# Patient Record
Sex: Male | Born: 1990 | Race: White | Hispanic: No | Marital: Single | Smoking: Former smoker
Health system: Southern US, Community
[De-identification: ages and names within clinical notes are randomized; demographics above are authoritative.]

## PROBLEM LIST (undated history)

## (undated) DIAGNOSIS — N200 Calculus of kidney: Secondary | ICD-10-CM

## (undated) DIAGNOSIS — IMO0002 Reserved for concepts with insufficient information to code with codable children: Secondary | ICD-10-CM

## (undated) HISTORY — PX: KIDNEY STONE SURGERY: SHX686

---

## 2010-10-12 ENCOUNTER — Emergency Department (HOSPITAL_COMMUNITY)
Admission: EM | Admit: 2010-10-12 | Discharge: 2010-10-13 | Disposition: A | Discharge status: Home / Self Care | Payer: BC Managed Care – PPO | Attending: Emergency Medicine | Admitting: Emergency Medicine

## 2010-10-12 ENCOUNTER — Emergency Department (HOSPITAL_COMMUNITY): Payer: BC Managed Care – PPO

## 2010-10-12 DIAGNOSIS — S40019A Contusion of unspecified shoulder, initial encounter: Secondary | ICD-10-CM | POA: Insufficient documentation

## 2010-10-12 DIAGNOSIS — W108XXA Fall (on) (from) other stairs and steps, initial encounter: Secondary | ICD-10-CM | POA: Insufficient documentation

## 2010-10-12 DIAGNOSIS — Y92009 Unspecified place in unspecified non-institutional (private) residence as the place of occurrence of the external cause: Secondary | ICD-10-CM | POA: Insufficient documentation

## 2011-06-23 ENCOUNTER — Emergency Department (HOSPITAL_COMMUNITY)
Admission: EM | Admit: 2011-06-23 | Discharge: 2011-06-23 | Disposition: A | Discharge status: Home / Self Care | Payer: BC Managed Care – PPO | Attending: Emergency Medicine | Admitting: Emergency Medicine

## 2011-06-23 ENCOUNTER — Emergency Department (HOSPITAL_COMMUNITY): Payer: BC Managed Care – PPO

## 2011-06-23 ENCOUNTER — Encounter: Payer: Self-pay | Admitting: Emergency Medicine

## 2011-06-23 DIAGNOSIS — S20229A Contusion of unspecified back wall of thorax, initial encounter: Secondary | ICD-10-CM | POA: Insufficient documentation

## 2011-06-23 DIAGNOSIS — S300XXA Contusion of lower back and pelvis, initial encounter: Secondary | ICD-10-CM

## 2011-06-23 DIAGNOSIS — W108XXA Fall (on) (from) other stairs and steps, initial encounter: Secondary | ICD-10-CM | POA: Insufficient documentation

## 2011-06-23 DIAGNOSIS — Z87442 Personal history of urinary calculi: Secondary | ICD-10-CM | POA: Insufficient documentation

## 2011-06-23 DIAGNOSIS — Z87891 Personal history of nicotine dependence: Secondary | ICD-10-CM | POA: Insufficient documentation

## 2011-06-23 HISTORY — DX: Calculus of kidney: N20.0

## 2011-06-23 MED ORDER — ONDANSETRON 8 MG PO TBDP
8.0000 mg | ORAL_TABLET | Freq: Once | ORAL | Status: AC
  Administered 2011-06-23: 8 mg via ORAL
  Filled 2011-06-23: qty 1

## 2011-06-23 MED ORDER — CYCLOBENZAPRINE HCL 5 MG PO TABS: 5.0000 mg | ORAL_TABLET | Freq: Three times a day (TID) | ORAL | Status: AC | PRN

## 2011-06-23 MED ORDER — OXYCODONE-ACETAMINOPHEN 5-325 MG PO TABS
2.0000 | ORAL_TABLET | Freq: Once | ORAL | Status: AC
  Administered 2011-06-23: 2 via ORAL
  Filled 2011-06-23: qty 2

## 2011-06-23 MED ORDER — CYCLOBENZAPRINE HCL 10 MG PO TABS: 10.0000 mg | ORAL_TABLET | Freq: Two times a day (BID) | ORAL | Status: AC | PRN

## 2011-06-23 MED ORDER — NAPROXEN 500 MG PO TABS: 500.0000 mg | ORAL_TABLET | Freq: Two times a day (BID) | ORAL | Status: DC

## 2011-06-23 MED ORDER — OXYCODONE-ACETAMINOPHEN 5-325 MG PO TABS: 1.0000 | ORAL_TABLET | ORAL | Status: AC | PRN

## 2011-06-23 NOTE — ED Notes (Signed)
Patient reports falling down 7-8 stairs this morning and hitting lower back. Patient reports pain radiating from lower back into legs bilaterally and up into shoulders and neck. Patient denies hitting neck or head. Denies any LOC, blurred vision, or dizziness.

## 2011-06-28 NOTE — ED Provider Notes (Signed)
History     CSN: [id number] Arrival date & time: 06/23/2011 11:57 AM   First MD Initiated Contact with Patient 06/23/11 1342      Chief Complaint  Patient presents with  . Fall  . Back Pain    (Consider location/radiation/quality/duration/timing/severity/associated sxs/prior treatment) Patient is a 20 y.o. male presenting with fall and back pain. The history is provided by the patient.  Fall The accident occurred 6 to 12 hours ago. The fall occurred while walking (He slipped going down a flight of steps this morning,  landing with his lower back against a step edge.). He fell from a height of 3 to 5 ft. Point of impact: lower back. Pain location: lower back. The pain is at a severity of 10/10. The pain is severe. He was ambulatory at the scene. Pertinent negatives include no fever, no numbness, no abdominal pain, no bowel incontinence, no nausea, no hematuria, no headaches, no loss of consciousness and no tingling. Associated symptoms comments: He denies hitting his head and denies any other injury or pain.. The symptoms are aggravated by pressure on the injury and activity. He has tried nothing for the symptoms. The treatment provided no relief.  Back Pain  Pertinent negatives include no chest pain, no fever, no numbness, no headaches, no abdominal pain, no bowel incontinence, no tingling and no weakness.    Past Medical History  Diagnosis Date  . Kidney stones     Past Surgical History  Procedure Date  . Kidney stone surgery     Family History  Problem Relation Age of Onset  . Hypertension Mother     History  Substance Use Topics  . Smoking status: Former Smoker -- 1.0 packs/day for 4 years    Types: Cigarettes    Quit date: 06/22/2004  . Smokeless tobacco: Current User    Types: Chew  . Alcohol Use: Yes     twice a month      Review of Systems  Constitutional: Negative for fever.  HENT: Negative for congestion, sore throat and neck pain.   Eyes: Negative.     Respiratory: Negative for chest tightness and shortness of breath.   Cardiovascular: Negative for chest pain.  Gastrointestinal: Negative for nausea, abdominal pain and bowel incontinence.  Genitourinary: Negative.  Negative for hematuria.  Musculoskeletal: Positive for back pain. Negative for joint swelling and arthralgias.  Skin: Negative.  Negative for rash and wound.  Neurological: Negative for dizziness, tingling, loss of consciousness, weakness, light-headedness, numbness and headaches.  Hematological: Negative.   Psychiatric/Behavioral: Negative.     Allergies  Review of patient's allergies indicates no known allergies.  Home Medications   Current Outpatient Rx  Name Route Sig Dispense Refill  . CYCLOBENZAPRINE HCL 10 MG PO TABS Oral Take 1 tablet (10 mg total) by mouth 2 (two) times daily as needed for muscle spasms. 20 tablet 0  . CYCLOBENZAPRINE HCL 5 MG PO TABS Oral Take 1 tablet (5 mg total) by mouth 3 (three) times daily as needed for muscle spasms. 15 tablet 0  . NAPROXEN 500 MG PO TABS Oral Take 1 tablet (500 mg total) by mouth 2 (two) times daily. 30 tablet 0  . OXYCODONE-ACETAMINOPHEN 5-325 MG PO TABS Oral Take 1 tablet by mouth every 4 (four) hours as needed for pain. 20 tablet 0    BP 122/82  Pulse 70  Temp(Src) 97.6 F (36.4 C) (Oral)  Resp 18  Ht 6' (1.829 m)  Wt 210 lb (95.255 kg)  BMI  28.48 kg/m2  SpO2 99%  Physical Exam  Nursing note and vitals reviewed. Constitutional: He is oriented to person, place, and time. He appears well-developed and well-nourished.  HENT:  Head: Normocephalic and atraumatic.  Eyes: Conjunctivae are normal.  Neck: Normal range of motion. Neck supple.  Cardiovascular: Normal rate, regular rhythm, normal heart sounds and intact distal pulses.        Pedal pulses normal.  Pulmonary/Chest: Effort normal and breath sounds normal. He has no wheezes.  Abdominal: Soft. Bowel sounds are normal. He exhibits no distension and no  mass. There is no tenderness.  Musculoskeletal: Normal range of motion. He exhibits no edema.       Lumbar back: He exhibits tenderness. He exhibits no swelling, no edema and no spasm.  Neurological: He is alert and oriented to person, place, and time. He has normal strength. He displays no atrophy and no tremor. No cranial nerve deficit or sensory deficit. Gait normal.  Reflex Scores:      Patellar reflexes are 2+ on the right side and 2+ on the left side.      Achilles reflexes are 2+ on the right side and 2+ on the left side.      No strength deficit noted in hip and knee flexor and extensor muscle groups.  Ankle flexion and extension intact.  Skin: Skin is warm and dry.  Psychiatric: He has a normal mood and affect.    ED Course  Procedures (including critical care time)  Labs Reviewed - No data to display No results found.   1. Contusion of lower back     Oxycodone given in ed (2 tabs) with moderate relief of pain.  MDM   Low back contusion.  NO neuro deficits on exam.  Patients labs and/or radiological studies were reviewed during the medical decision making and disposition process.        Candis Musa, PA 06/28/11 2101

## 2011-06-29 NOTE — ED Provider Notes (Signed)
Medical screening examination/treatment/procedure(s) were performed by non-physician practitioner and as supervising physician I was immediately available for consultation/collaboration.   Meesha Sek W. Dywane Peruski, MD 06/29/11 0728 

## 2011-12-23 ENCOUNTER — Encounter (HOSPITAL_COMMUNITY): Payer: Self-pay | Admitting: *Deleted

## 2011-12-23 DIAGNOSIS — M546 Pain in thoracic spine: Secondary | ICD-10-CM | POA: Insufficient documentation

## 2011-12-23 DIAGNOSIS — S139XXA Sprain of joints and ligaments of unspecified parts of neck, initial encounter: Secondary | ICD-10-CM | POA: Insufficient documentation

## 2011-12-23 DIAGNOSIS — M542 Cervicalgia: Secondary | ICD-10-CM | POA: Insufficient documentation

## 2011-12-23 DIAGNOSIS — W1789XA Other fall from one level to another, initial encounter: Secondary | ICD-10-CM | POA: Insufficient documentation

## 2011-12-23 DIAGNOSIS — Z87891 Personal history of nicotine dependence: Secondary | ICD-10-CM | POA: Insufficient documentation

## 2011-12-23 DIAGNOSIS — S20229A Contusion of unspecified back wall of thorax, initial encounter: Secondary | ICD-10-CM | POA: Insufficient documentation

## 2011-12-23 NOTE — ED Notes (Signed)
Larey Seat off back of truck backwards onto back, No LOC pain neck and back.  Alert, ambulatory into triage.  C collar placed on pt

## 2011-12-24 ENCOUNTER — Emergency Department (HOSPITAL_COMMUNITY): Payer: BC Managed Care – PPO

## 2011-12-24 ENCOUNTER — Emergency Department (HOSPITAL_COMMUNITY)
Admission: EM | Admit: 2011-12-24 | Discharge: 2011-12-24 | Disposition: A | Discharge status: Home / Self Care | Payer: BC Managed Care – PPO | Attending: Emergency Medicine | Admitting: Emergency Medicine

## 2011-12-24 DIAGNOSIS — W19XXXA Unspecified fall, initial encounter: Secondary | ICD-10-CM

## 2011-12-24 DIAGNOSIS — S20229A Contusion of unspecified back wall of thorax, initial encounter: Secondary | ICD-10-CM

## 2011-12-24 DIAGNOSIS — S161XXA Strain of muscle, fascia and tendon at neck level, initial encounter: Secondary | ICD-10-CM

## 2011-12-24 MED ORDER — HYDROMORPHONE HCL PF 2 MG/ML IJ SOLN
2.0000 mg | Freq: Once | INTRAMUSCULAR | Status: AC
  Administered 2011-12-24: 2 mg via INTRAMUSCULAR
  Filled 2011-12-24: qty 1

## 2011-12-24 MED ORDER — NAPROXEN 500 MG PO TABS: 500.0000 mg | ORAL_TABLET | Freq: Two times a day (BID) | ORAL | Status: DC

## 2011-12-24 MED ORDER — OXYCODONE-ACETAMINOPHEN 5-325 MG PO TABS: 1.0000 | ORAL_TABLET | Freq: Four times a day (QID) | ORAL | Status: AC | PRN

## 2011-12-24 NOTE — Discharge Instructions (Signed)
Rest at home until Thursday.  Follow up with your md if not improving.

## 2011-12-24 NOTE — ED Notes (Signed)
Discharge instructions reviewed with pt, questions answered. Pt verbalized understanding.  

## 2011-12-24 NOTE — ED Provider Notes (Cosign Needed)
History     CSN: [id number]  Arrival date & time 12/23/11  2057   First MD Initiated Contact with Patient 12/24/11 0125      Chief Complaint  Patient presents with  . Fall    (Consider location/radiation/quality/duration/timing/severity/associated sxs/prior treatment) Patient is a 21 y.o. male presenting with fall. The history is provided by the patient. No language interpreter was used.  Fall The accident occurred 6 to 12 hours ago. The fall occurred while recreating/playing. He fell from a height of 3 to 5 ft. He landed on concrete. Point of impact: upper back. The pain is present in the head. The pain is at a severity of 7/10. The pain is moderate. He was ambulatory at the scene. There was no entrapment after the fall. There was no drug use involved in the accident. There was no alcohol use involved in the accident. Pertinent negatives include no visual change, no abdominal pain, no hematuria and no headaches. The symptoms are aggravated by ambulation. He has tried nothing for the symptoms. The treatment provided no relief.    Past Medical History  Diagnosis Date  . Kidney stones     Past Surgical History  Procedure Date  . Kidney stone surgery     Family History  Problem Relation Age of Onset  . Hypertension Mother     History  Substance Use Topics  . Smoking status: Former Smoker -- 1.0 packs/day for 4 years    Types: Cigarettes    Quit date: 06/22/2004  . Smokeless tobacco: Current User    Types: Chew  . Alcohol Use: Yes     twice a month      Review of Systems  Constitutional: Negative for fatigue.  HENT: Negative for congestion, sinus pressure and ear discharge.   Eyes: Negative for discharge.  Respiratory: Negative for cough.   Cardiovascular: Negative for chest pain.  Gastrointestinal: Negative for abdominal pain and diarrhea.  Genitourinary: Negative for frequency and hematuria.  Musculoskeletal: Positive for back pain.  Skin: Negative for rash.    Neurological: Negative for seizures and headaches.  Hematological: Negative.   Psychiatric/Behavioral: Negative for hallucinations.    Allergies  Review of patient's allergies indicates no known allergies.  Home Medications   Current Outpatient Rx  Name Route Sig Dispense Refill  . GOODY HEADACHE PO Oral Take 1 packet by mouth once as needed. For pain      BP 163/94  Pulse 83  Temp(Src) 98.2 F (36.8 C) (Oral)  Resp 20  Ht 6' (1.829 m)  Wt 193 lb (87.544 kg)  BMI 26.18 kg/m2  SpO2 100%  Physical Exam  Constitutional: He is oriented to person, place, and time. He appears well-developed.  HENT:  Head: Normocephalic and atraumatic.  Eyes: Conjunctivae and EOM are normal. No scleral icterus.  Neck: Neck supple. No thyromegaly present.  Cardiovascular: Normal rate and regular rhythm.  Exam reveals no gallop and no friction rub.   No murmur heard. Pulmonary/Chest: No stridor. He has no wheezes. He has no rales. He exhibits no tenderness.  Abdominal: He exhibits no distension. There is no tenderness. There is no rebound.  Musculoskeletal: Normal range of motion. He exhibits tenderness. He exhibits no edema.       Tender upper thoracic  Lymphadenopathy:    He has no cervical adenopathy.  Neurological: He is oriented to person, place, and time. He displays normal reflexes. He exhibits normal muscle tone. Coordination normal.  Skin: No rash noted. No erythema.  Psychiatric:  He has a normal mood and affect. His behavior is normal.    ED Course  Procedures (including critical care time)  Labs Reviewed - No data to display No results found.       MDM          Benny Lennert, MD 12/24/11 1610  Benny Lennert, MD 12/24/11 [phone]

## 2012-02-18 ENCOUNTER — Emergency Department (HOSPITAL_COMMUNITY): Payer: BC Managed Care – PPO

## 2012-02-18 ENCOUNTER — Encounter (HOSPITAL_COMMUNITY): Payer: Self-pay

## 2012-02-18 ENCOUNTER — Emergency Department (HOSPITAL_COMMUNITY)
Admission: EM | Admit: 2012-02-18 | Discharge: 2012-02-18 | Disposition: A | Discharge status: Home / Self Care | Payer: BC Managed Care – PPO | Attending: Emergency Medicine | Admitting: Emergency Medicine

## 2012-02-18 DIAGNOSIS — Y92009 Unspecified place in unspecified non-institutional (private) residence as the place of occurrence of the external cause: Secondary | ICD-10-CM | POA: Insufficient documentation

## 2012-02-18 DIAGNOSIS — M25461 Effusion, right knee: Secondary | ICD-10-CM

## 2012-02-18 DIAGNOSIS — X500XXA Overexertion from strenuous movement or load, initial encounter: Secondary | ICD-10-CM | POA: Insufficient documentation

## 2012-02-18 DIAGNOSIS — S39012A Strain of muscle, fascia and tendon of lower back, initial encounter: Secondary | ICD-10-CM

## 2012-02-18 DIAGNOSIS — M545 Low back pain, unspecified: Secondary | ICD-10-CM | POA: Insufficient documentation

## 2012-02-18 DIAGNOSIS — S335XXA Sprain of ligaments of lumbar spine, initial encounter: Secondary | ICD-10-CM | POA: Insufficient documentation

## 2012-02-18 DIAGNOSIS — M25569 Pain in unspecified knee: Secondary | ICD-10-CM | POA: Insufficient documentation

## 2012-02-18 MED ORDER — OXYCODONE-ACETAMINOPHEN 5-325 MG PO TABS
1.0000 | ORAL_TABLET | Freq: Once | ORAL | Status: AC
  Administered 2012-02-18: 1 via ORAL
  Filled 2012-02-18: qty 1

## 2012-02-18 MED ORDER — HYDROCODONE-ACETAMINOPHEN 5-325 MG PO TABS
1.0000 | ORAL_TABLET | Freq: Once | ORAL | Status: AC
  Administered 2012-02-18: 1 via ORAL
  Filled 2012-02-18: qty 1

## 2012-02-18 MED ORDER — IBUPROFEN 600 MG PO TABS: 600.0000 mg | ORAL_TABLET | Freq: Four times a day (QID) | ORAL | Status: AC | PRN

## 2012-02-18 MED ORDER — HYDROCODONE-ACETAMINOPHEN 5-325 MG PO TABS: 1.0000 | ORAL_TABLET | ORAL | Status: AC | PRN

## 2012-02-18 NOTE — ED Notes (Signed)
Pt stated he tripped and fell into hole this am, right knee pain and low back pain. Denies loc

## 2012-02-18 NOTE — ED Notes (Signed)
Pt was walking in yard today and fell into hole, denies loc, cont. To have right knee pain and low back pain.

## 2012-02-18 NOTE — Discharge Instructions (Signed)
Knee Effusion The medical term for having fluid in your knee is effusion. This is often due to an internal derangement of the knee. This means something is wrong inside the knee. Some of the causes of fluid in the knee may be torn cartilage, a torn ligament, or bleeding into the joint from an injury. Your knee is likely more difficult to bend and move. This is often because there is increased pain and pressure in the joint. The time it takes for recovery from a knee effusion depends on different factors, including:   Type of injury.   Your age.   Physical and medical conditions.   Rehabilitation Strategies.  How long you will be away from your normal activities will depend on what kind of knee problem you have and how much damage is present. Your knee has two types of cartilage. Articular cartilage covers the bone ends and lets your knee bend and move smoothly. Two menisci, thick pads of cartilage that form a rim inside the joint, help absorb shock and stabilize your knee. Ligaments bind the bones together and support your knee joint. Muscles move the joint, help support your knee, and take stress off the joint itself. CAUSES  Often an effusion in the knee is caused by an injury to one of the menisci. This is often a tear in the cartilage. Recovery after a meniscus injury depends on how much meniscus is damaged and whether you have damaged other knee tissue. Small tears may heal on their own with conservative treatment. Conservative means rest, limited weight bearing activity and muscle strengthening exercises. Your recovery may take up to 6 weeks.  TREATMENT  Larger tears may require surgery. Meniscus injuries may be treated during arthroscopy. Arthroscopy is a procedure in which your surgeon uses a small telescope like instrument to look in your knee. Your caregiver can make a more accurate diagnosis (learning what is wrong) by performing an arthroscopic procedure. If your injury is on the inner  margin of the meniscus, your surgeon may trim the meniscus back to a smooth rim. In other cases your surgeon will try to repair a damaged meniscus with stitches (sutures). This may make rehabilitation take longer, but may provide better long term result by helping your knee keep its shock absorption capabilities. Ligaments which are completely torn usually require surgery for repair. HOME CARE INSTRUCTIONS  Use crutches as instructed.   If a brace is applied, use as directed.   Once you are home, an ice pack applied to your swollen knee may help with discomfort and help decrease swelling.   Keep your knee raised (elevated) when you are not up and around or on crutches.   Only take over-the-counter or prescription medicines for pain, discomfort, or fever as directed by your caregiver.   Your caregivers will help with instructions for rehabilitation of your knee. This often includes strengthening exercises.   You may resume a normal diet and activities as directed.  SEEK MEDICAL CARE IF:   There is increased swelling in your knee.   You notice redness, swelling, or increasing pain in your knee.   An unexplained oral temperature above 102 F (38.9 C) develops.  SEEK IMMEDIATE MEDICAL CARE IF:   You develop a rash.   You have difficulty breathing.   You have any allergic reactions from medications you may have been given.   There is severe pain with any motion of the knee.  MAKE SURE YOU:   Understand these instructions.  Will watch your condition.   Will get help right away if you are not doing well or get worse.  Document Released: 11/09/2003 Document Revised: 08/08/2011 Document Reviewed: 01/13/2008 [facility] Patient Information 2012 [place], [place].Lumbosacral Strain Lumbosacral strain is one of the most common causes of back pain. There are many causes of back pain. Most are not serious conditions. CAUSES  Your backbone (spinal column) is made up of 24 main vertebral  bodies, the sacrum, and the coccyx. These are held together by muscles and tough, fibrous tissue (ligaments). Nerve roots pass through the openings between the vertebrae. A sudden move or injury to the back may cause injury to, or pressure on, these nerves. This may result in localized back pain or pain movement (radiation) into the buttocks, down the leg, and into the foot. Sharp, shooting pain from the buttock down the back of the leg (sciatica) is frequently associated with a ruptured (herniated) disk. Pain may be caused by muscle spasm alone. Your caregiver can often find the cause of your pain by the details of your symptoms and an exam. In some cases, you may need tests (such as X-rays). Your caregiver will work with you to decide if any tests are needed based on your specific exam. HOME CARE INSTRUCTIONS   Avoid an underactive lifestyle. Active exercise, as directed by your caregiver, is your greatest weapon against back pain.   Avoid hard physical activities (tennis, racquetball, waterskiing) if you are not in proper physical condition for it. This may aggravate or create problems.   If you have a back problem, avoid sports requiring sudden body movements. Swimming and walking are generally safer activities.   Maintain good posture.   Avoid becoming overweight (obese).   Use bed rest for only the most extreme, sudden (acute) episode. Your caregiver will help you determine how much bed rest is necessary.   For acute conditions, you may put ice on the injured area.   Put ice in a plastic bag.   Place a towel between your skin and the bag.   Leave the ice on for 15 to 20 minutes at a time, every 2 hours, or as needed.   After you are improved and more active, it may help to apply heat for 30 minutes before activities.  See your caregiver if you are having pain that lasts longer than expected. Your caregiver can advise appropriate exercises or therapy if needed. With conditioning, most  back problems can be avoided. SEEK IMMEDIATE MEDICAL CARE IF:   You have numbness, tingling, weakness, or problems with the use of your arms or legs.   You experience severe back pain not relieved with medicines.   There is a change in bowel or bladder control.   You have increasing pain in any area of the body, including your belly (abdomen).   You notice shortness of breath, dizziness, or feel faint.   You feel sick to your stomach (nauseous), are throwing up (vomiting), or become sweaty.   You notice discoloration of your toes or legs, or your feet get very cold.   Your back pain is getting worse.   You have a fever.  MAKE SURE YOU:   Understand these instructions.   Will watch your condition.   Will get help right away if you are not doing well or get worse.  Document Released: 05/29/2005 Document Revised: 08/08/2011 Document Reviewed: 11/18/2008 [facility] Patient Information 2012 [place], [place].   Use the medications as directed.  Do not drive  within 4 hours of taking oxycodone as this will make you drowsy.  Avoid lifting,  Bending,  Twisting or any other activity that worsens your pain over the next week.  Apply an  icepack  to your lower back for 10-15 minutes every 2 hours for the next 2 days.   Wear the knee brace and use crutches to minimize weightbearing on your right knee. You should get rechecked if your symptoms are not better over the next 5 days,  Or you develop increased pain,  Weakness in your leg(s) or loss of bladder or bowel function - these are symptoms of a worse injury.

## 2012-02-19 NOTE — ED Provider Notes (Signed)
History     CSN: [id number]  Arrival date & time 02/18/12  4132   First MD Initiated Contact with Patient 02/18/12 1001      Chief Complaint  Patient presents with  . Fall    (Consider location/radiation/quality/duration/timing/severity/associated sxs/prior treatment) HPI Comments: Hafiz Irion in all day in his yard while walking and twisted his right knee and jerked his lower back during this event.  He has continued pain at these sites which have not improved despite using ice packs and ibuprofen.  His low back pain is constant but worse with certain positions and without radiation.  He describes it as aching.  The pain in his right knee is also constant without radiation at baseline, but with certain movements such as planting and twisting his body he has sharp jabs of pain into his upper tibia.  He denies weakness or numbness in his lower extremities and has been able to ambulate but reports his right knee feels "weak".  He has had no bladder or bowel incontinence or retention.  Patient is a 21 y.o. male presenting with fall. The history is provided by the patient.  Fall Pertinent negatives include no fever, no numbness and no abdominal pain.    Past Medical History  Diagnosis Date  . Kidney stones     Past Surgical History  Procedure Date  . Kidney stone surgery     Family History  Problem Relation Age of Onset  . Hypertension Mother     History  Substance Use Topics  . Smoking status: Former Smoker -- 1.0 packs/day for 4 years    Types: Cigarettes    Quit date: 06/22/2004  . Smokeless tobacco: Current User    Types: Chew  . Alcohol Use: Yes     twice a month      Review of Systems  Constitutional: Negative for fever.  Respiratory: Negative for shortness of breath.   Cardiovascular: Negative for chest pain and leg swelling.  Gastrointestinal: Negative for abdominal pain, constipation and abdominal distention.  Genitourinary: Negative for dysuria, urgency,  frequency, flank pain and difficulty urinating.  Musculoskeletal: Positive for back pain and arthralgias. Negative for joint swelling and gait problem.  Skin: Negative for rash.  Neurological: Negative for weakness and numbness.    Allergies  Review of patient's allergies indicates no known allergies.  Home Medications   Current Outpatient Rx  Name Route Sig Dispense Refill  . HYDROCODONE-ACETAMINOPHEN 5-325 MG PO TABS Oral Take 1 tablet by mouth every 4 (four) hours as needed for pain. 20 tablet 0  . IBUPROFEN 600 MG PO TABS Oral Take 1 tablet (600 mg total) by mouth every 6 (six) hours as needed for pain. 30 tablet 0    BP 129/75  Pulse 60  Temp 98 F (36.7 C) (Oral)  Resp 20  Ht 5\' 10"  (1.778 m)  Wt 210 lb (95.255 kg)  BMI 30.13 kg/m2  SpO2 99%  Physical Exam  Nursing note and vitals reviewed. Constitutional: He appears well-developed and well-nourished.  HENT:  Head: Normocephalic.  Eyes: Conjunctivae are normal.  Neck: Normal range of motion. Neck supple.  Cardiovascular: Normal rate and intact distal pulses.        Pedal pulses normal.  Pulmonary/Chest: Effort normal.  Abdominal: Soft. Bowel sounds are normal. He exhibits no distension and no mass.  Musculoskeletal: Normal range of motion. He exhibits tenderness. He exhibits no edema.       Right knee: He exhibits normal range of motion, no  swelling, no ecchymosis, no deformity, no erythema, no LCL laxity and no MCL laxity. tenderness found. Medial joint line tenderness noted.       Lumbar back: He exhibits tenderness. He exhibits no swelling, no edema and no spasm.  Neurological: He is alert. He has normal strength. He displays no atrophy and no tremor. No sensory deficit. Gait normal.  Reflex Scores:      Patellar reflexes are 2+ on the right side and 2+ on the left side.      Achilles reflexes are 2+ on the right side and 2+ on the left side.      No strength deficit noted in hip and knee flexor and extensor  muscle groups.  Ankle flexion and extension intact.  Skin: Skin is warm and dry.  Psychiatric: He has a normal mood and affect.    ED Course  Procedures (including critical care time)  Labs Reviewed - No data to display Dg Lumbar Spine Complete  02/18/2012  *RADIOLOGY REPORT*  Clinical Data: 21 year old male right sided pain after blunt trauma and twisting injury.  LUMBAR SPINE - COMPLETE 4+ VIEW  Comparison: 06/23/2011.  Findings: Normal lumbar segmentation. Bone mineralization is within normal limits.  Stable vertebral height alignment.  Stable relatively preserved disc spaces.  Chronic mild L5-S1 facet hypertrophy and sclerosis is stable.  SI joints within normal limits.  IMPRESSION: No acute osseous abnormality in the lumbar spine.  Original Report Authenticated By: Harley Hallmark, M.D.   Dg Knee Complete 4 Views Right  02/18/2012  *RADIOLOGY REPORT*  Clinical Data: 21 year old male with pain status post fall and twisting injury.  RIGHT KNEE - COMPLETE 4+ VIEW  Comparison: None.  Findings: There may be a trace suprapatellar joint effusion.  Joint spaces are preserved, the patella is intact, and bone mineralization is within normal limits.  No fracture or dislocation.  IMPRESSION: Possible small joint effusion.  No acute fracture or dislocation.  Original Report Authenticated By: Ulla Potash III, M.D.     1. Knee effusion, right   2. Lumbar strain       MDM  Patient was applied with a knee immobilizer and crutches.  He was also given an ice pack for his knee.  He was prescribed ibuprofen and hydrocodone for pain and inflammation.  He was advised to use ice on his injury sites through tomorrow, then can switch to intermittent heat therapy.  He does have an orthopedist in [place] and and he was advised to followup with this physician for further management if his symptoms persist beyond the next several days.        Burgess Amor, [place] 02/19/12 1443

## 2012-02-21 NOTE — ED Provider Notes (Signed)
Medical screening examination/treatment/procedure(s) were performed by non-physician practitioner and as supervising physician I was immediately available for consultation/collaboration.  Berdella Bacot, MD 02/21/12 1118 

## 2012-02-26 ENCOUNTER — Ambulatory Visit: Payer: BC Managed Care – PPO | Admitting: Orthopedic Surgery

## 2012-02-26 ENCOUNTER — Encounter: Payer: Self-pay | Admitting: Orthopedic Surgery

## 2012-04-04 ENCOUNTER — Emergency Department (HOSPITAL_COMMUNITY)
Admission: EM | Admit: 2012-04-04 | Discharge: 2012-04-04 | Disposition: A | Discharge status: Home / Self Care | Payer: BC Managed Care – PPO | Attending: Emergency Medicine | Admitting: Emergency Medicine

## 2012-04-04 ENCOUNTER — Emergency Department (HOSPITAL_COMMUNITY): Payer: BC Managed Care – PPO

## 2012-04-04 ENCOUNTER — Encounter (HOSPITAL_COMMUNITY): Payer: Self-pay

## 2012-04-04 DIAGNOSIS — R109 Unspecified abdominal pain: Secondary | ICD-10-CM | POA: Insufficient documentation

## 2012-04-04 DIAGNOSIS — N201 Calculus of ureter: Secondary | ICD-10-CM | POA: Insufficient documentation

## 2012-04-04 DIAGNOSIS — N23 Unspecified renal colic: Secondary | ICD-10-CM

## 2012-04-04 LAB — URINALYSIS, ROUTINE W REFLEX MICROSCOPIC
Bilirubin Urine: NEGATIVE
Glucose, UA: NEGATIVE mg/dL
Ketones, ur: NEGATIVE mg/dL
Protein, ur: NEGATIVE mg/dL
pH: 6 (ref 5.0–8.0)

## 2012-04-04 LAB — URINE MICROSCOPIC-ADD ON

## 2012-04-04 MED ORDER — MORPHINE SULFATE 4 MG/ML IJ SOLN
4.0000 mg | INTRAMUSCULAR | Status: AC | PRN
  Administered 2012-04-04 (×2): 4 mg via INTRAVENOUS
  Filled 2012-04-04 (×2): qty 1

## 2012-04-04 MED ORDER — OXYCODONE-ACETAMINOPHEN 5-325 MG PO TABS: ORAL_TABLET | ORAL | Status: AC

## 2012-04-04 MED ORDER — SODIUM CHLORIDE 0.9 % IV SOLN: INTRAVENOUS | Status: DC

## 2012-04-04 MED ORDER — ONDANSETRON HCL 4 MG/2ML IJ SOLN
4.0000 mg | INTRAMUSCULAR | Status: DC | PRN
  Administered 2012-04-04: 4 mg via INTRAVENOUS
  Filled 2012-04-04: qty 2

## 2012-04-04 MED ORDER — KETOROLAC TROMETHAMINE 30 MG/ML IJ SOLN
30.0000 mg | Freq: Once | INTRAMUSCULAR | Status: AC
  Administered 2012-04-04: 30 mg via INTRAVENOUS
  Filled 2012-04-04: qty 1

## 2012-04-04 MED ORDER — ONDANSETRON HCL 4 MG PO TABS: 4.0000 mg | ORAL_TABLET | Freq: Three times a day (TID) | ORAL | Status: AC | PRN

## 2012-04-04 MED ORDER — SODIUM CHLORIDE 0.9 % IV SOLN
INTRAVENOUS | Status: DC
  Administered 2012-04-04: 16:00:00 via INTRAVENOUS

## 2012-04-04 MED ORDER — TAMSULOSIN HCL 0.4 MG PO CAPS: 0.4000 mg | ORAL_CAPSULE | Freq: Every day | ORAL | Status: DC

## 2012-04-04 MED ORDER — HYDROMORPHONE HCL PF 1 MG/ML IJ SOLN
1.0000 mg | INTRAMUSCULAR | Status: DC | PRN
  Administered 2012-04-04 (×2): 1 mg via INTRAVENOUS
  Filled 2012-04-04 (×2): qty 1

## 2012-04-04 NOTE — ED Notes (Signed)
Pt return from CT.

## 2012-04-04 NOTE — ED Provider Notes (Signed)
History     CSN: [id number]  Arrival date & time 04/04/12  1445   First MD Initiated Contact with Patient 04/04/12 1455      Chief Complaint  Patient presents with  . Flank Pain    HPI Pt was seen at 1500.  Per pt, c/o gradual onset and persistence of constant right sided flank "pain" for the past several hours.  Has been associated with nausea.  Pt describes the pain as "it feels like my kidney stones."  States his last kidney stone was dx approx 1 yr ago dx in [facility] and he followed up with Uro Dr. Baldo Ash.  Denies dysuria, no testicular pain/swelling, no abd pain, no vomiting/diarrhea, no fevers.     Uro: Dr. Baldo Ash Past Medical History  Diagnosis Date  . Kidney stones     Past Surgical History  Procedure Date  . Kidney stone surgery     Family History  Problem Relation Age of Onset  . Hypertension Mother     History  Substance Use Topics  . Smoking status: Former Smoker -- 1.0 packs/day for 4 years    Types: Cigarettes    Quit date: 06/22/2004  . Smokeless tobacco: Current User    Types: Chew  . Alcohol Use: Yes     twice a month    Review of Systems ROS: Statement: All systems negative except as marked or noted in the HPI; Constitutional: Negative for fever and chills. ; ; Eyes: Negative for eye pain, redness and discharge. ; ; ENMT: Negative for ear pain, hoarseness, nasal congestion, sinus pressure and sore throat. ; ; Cardiovascular: Negative for chest pain, palpitations, diaphoresis, dyspnea and peripheral edema. ; ; Respiratory: Negative for cough, wheezing and stridor. ; ; Gastrointestinal: +nausea. Negative for vomiting, diarrhea, abdominal pain, blood in stool, hematemesis, jaundice and rectal bleeding. . ; ; Genitourinary: +flank pain. Negative for dysuria and hematuria. ; ;  Genital:  No penile drainage or rash, no testicular pain or swelling, no scrotal rash or swelling.;; Musculoskeletal: Negative for back pain and neck pain. Negative for swelling  and trauma.; ; Skin: Negative for pruritus, rash, abrasions, blisters, bruising and skin lesion.; ; Neuro: Negative for headache, lightheadedness and neck stiffness. Negative for weakness, altered level of consciousness , altered mental status, extremity weakness, paresthesias, involuntary movement, seizure and syncope.     Allergies  Review of patient's allergies indicates no known allergies.  Home Medications  No current outpatient prescriptions on file.  BP 141/90  Pulse 65  Resp 20  Ht 6\' 5"  (1.956 m)  Wt 200 lb (90.719 kg)  BMI 23.72 kg/m2  SpO2 100%  Physical Exam 1505: Physical examination:  Nursing notes reviewed; Vital signs and O2 SAT reviewed;  Constitutional: Well developed, Well nourished, Well hydrated, In no acute distress; Head:  Normocephalic, atraumatic; Eyes: EOMI, PERRL, No scleral icterus; ENMT: Mouth and pharynx normal, Mucous membranes moist; Neck: Supple, Full range of motion, No lymphadenopathy; Cardiovascular: Regular rate and rhythm, No gallop; Respiratory: Breath sounds clear & equal bilaterally, No wheezes.  Speaking full sentences with ease, Normal respiratory effort/excursion; Chest: Nontender, Movement normal; Abdomen: Soft, Nontender, Nondistended, Normal bowel sounds; Genitourinary: No CVA tenderness; Spine:  No midline CS, TS, LS tenderness.  +TTP right lumbar paraspinal muscles.;; Extremities: Pulses normal, No tenderness, No edema, No calf edema or asymmetry.; Neuro: AA&Ox3, Major CN grossly intact.  Speech clear. No gross focal motor or sensory deficits in extremities.; Skin: Color normal, Warm, Dry.   ED Course  Procedures    MDM  MDM Reviewed: nursing note, vitals and previous chart Interpretation: CT scan and labs     Results for orders placed during the hospital encounter of 04/04/12  URINALYSIS, ROUTINE W REFLEX MICROSCOPIC      Component Value Range   Color, Urine YELLOW  YELLOW   APPearance CLEAR  CLEAR   Specific Gravity, Urine 1.025   1.005 - 1.030   pH 6.0  5.0 - 8.0   Glucose, UA NEGATIVE  NEGATIVE mg/dL   Hgb urine dipstick LARGE (*) NEGATIVE   Bilirubin Urine NEGATIVE  NEGATIVE   Ketones, ur NEGATIVE  NEGATIVE mg/dL   Protein, ur NEGATIVE  NEGATIVE mg/dL   Urobilinogen, UA 0.2  0.0 - 1.0 mg/dL   Nitrite NEGATIVE  NEGATIVE   Leukocytes, UA NEGATIVE  NEGATIVE  URINE MICROSCOPIC-ADD ON      Component Value Range   Squamous Epithelial / LPF RARE  RARE   WBC, UA 3-6  <3 WBC/hpf   RBC / HPF 21-50  <3 RBC/hpf   Bacteria, UA RARE  RARE   Crystals CA OXALATE CRYSTALS (*) NEGATIVE   Ct Abdomen Pelvis Wo Contrast 04/04/2012  *RADIOLOGY REPORT*  Clinical Data: Right-sided flank pain.  CT ABDOMEN AND PELVIS WITHOUT CONTRAST  Technique:  Multidetector CT imaging of the abdomen and pelvis was performed following the standard protocol without intravenous contrast.  Comparison: CT of the abdomen and pelvis 03/23/2011.  Findings:  Lung Bases: Unremarkable.  Abdomen/Pelvis:  3 mm nonobstructive calculus in the lower pole collecting system of the right kidney.  Image 44 of series 2 demonstrates a 3 mm calculus in the proximal third of the right ureter. Image 72 of series 2 demonstrates a potential 1-2 mm calculus in the distal third of the right ureter shortly before the right ureterovesicular junction. There is very mild right-sided hydroureteronephrosis which originates proximal to the calculus in the proximal third of the right ureter.  No left renal, ureteral or bladder calculi identified.  The noncontrast appearance of the liver, gallbladder, pancreas, spleen and bilateral adrenal glands is unremarkable.  There is no ascites or pneumoperitoneum and no pathologic distension of bowel. Normal appendix.  No definite pathologic lymphadenopathy identified within the abdomen or pelvis on this noncontrast CT examination. Prostate and urinary bladder are unremarkable in appearance.  Musculoskeletal: There are no aggressive appearing lytic or  blastic lesions noted in the visualized portions of the skeleton.  IMPRESSION: 1.  3 mm calculus in the proximal third of the right ureter with mild proximal hydroureteronephrosis indicative of some low level obstruction at this time.  Additional nonobstructive calculus in the lower pole collecting system of the right kidney, and potential 1 - 2 mm calculus in the distal third of the right ureter, as above. 2.  Normal appendix.  Original Report Authenticated By: Florencia Reasons, M.D.     1800:  Pt states he feels "better now" after meds and wants to go home.  No N/V while in the ED.  VSS.  No clear UTI on Udip, UC is pending.  Has a Uro MD he has seen previously; pt states he will f/u with him this week.  Dx testing d/w pt.  Questions answered.  Verb understanding, agreeable to d/c home with outpt f/u.       Laray Anger, DO 04/06/12 1353

## 2012-04-04 NOTE — ED Notes (Signed)
Complain of pain right flank area also, nausea. Hx of kidney stone

## 2012-04-07 LAB — URINE CULTURE: Colony Count: NO GROWTH

## 2012-05-12 ENCOUNTER — Emergency Department (HOSPITAL_COMMUNITY)
Admission: EM | Admit: 2012-05-12 | Discharge: 2012-05-12 | Disposition: A | Discharge status: Home / Self Care | Payer: BC Managed Care – PPO | Attending: Emergency Medicine | Admitting: Emergency Medicine

## 2012-05-12 ENCOUNTER — Emergency Department (HOSPITAL_COMMUNITY): Payer: BC Managed Care – PPO

## 2012-05-12 ENCOUNTER — Encounter (HOSPITAL_COMMUNITY): Payer: Self-pay | Admitting: Emergency Medicine

## 2012-05-12 DIAGNOSIS — W108XXA Fall (on) (from) other stairs and steps, initial encounter: Secondary | ICD-10-CM | POA: Insufficient documentation

## 2012-05-12 DIAGNOSIS — Z87891 Personal history of nicotine dependence: Secondary | ICD-10-CM | POA: Insufficient documentation

## 2012-05-12 DIAGNOSIS — S20229A Contusion of unspecified back wall of thorax, initial encounter: Secondary | ICD-10-CM | POA: Insufficient documentation

## 2012-05-12 DIAGNOSIS — Z8249 Family history of ischemic heart disease and other diseases of the circulatory system: Secondary | ICD-10-CM | POA: Insufficient documentation

## 2012-05-12 DIAGNOSIS — S300XXA Contusion of lower back and pelvis, initial encounter: Secondary | ICD-10-CM

## 2012-05-12 MED ORDER — HYDROCODONE-ACETAMINOPHEN 5-325 MG PO TABS
1.0000 | ORAL_TABLET | Freq: Once | ORAL | Status: AC
  Administered 2012-05-12: 1 via ORAL
  Filled 2012-05-12: qty 1

## 2012-05-12 MED ORDER — HYDROCODONE-ACETAMINOPHEN 5-325 MG PO TABS: 1.0000 | ORAL_TABLET | Freq: Four times a day (QID) | ORAL | Status: AC | PRN

## 2012-05-12 MED ORDER — IBUPROFEN 800 MG PO TABS
800.0000 mg | ORAL_TABLET | Freq: Once | ORAL | Status: AC
  Administered 2012-05-12: 800 mg via ORAL
  Filled 2012-05-12: qty 1

## 2012-05-12 NOTE — ED Notes (Signed)
Pt c/o mid-lower back pain 10/10 after tripped in falling down approximately 6 stairs. Denies hitting head/loc. nad noted.

## 2012-05-12 NOTE — ED Provider Notes (Signed)
History     CSN: [id number]  Arrival date & time 05/12/12  1308   First MD Initiated Contact with Patient 05/12/12 1022      Chief Complaint  Patient presents with  . Fall  . Back Pain    (Consider location/radiation/quality/duration/timing/severity/associated sxs/prior treatment) HPI Comments: Pt was carrying his son.  He tripped and fell striking his back on his outside steps.  Patient is a 21 y.o. male presenting with fall and back pain. The history is provided by the patient. No language interpreter was used.  Fall The accident occurred 1 to 2 hours ago. The fall occurred while walking. Impact surface: stair edge. Point of impact: lower back. Pain location: lower back. He was ambulatory at the scene. There was no entrapment after the fall. There was no drug use involved in the accident. There was no alcohol use involved in the accident. Pertinent negatives include no numbness, no bowel incontinence, no vomiting, no hematuria and no loss of consciousness. The symptoms are aggravated by activity, standing and pressure on the injury. He has tried nothing for the symptoms.  Back Pain  Pertinent negatives include no numbness, no bowel incontinence and no weakness.    Past Medical History  Diagnosis Date  . Kidney stones     Past Surgical History  Procedure Date  . Kidney stone surgery     Family History  Problem Relation Age of Onset  . Hypertension Mother     History  Substance Use Topics  . Smoking status: Former Smoker -- 1.0 packs/day for 4 years    Types: Cigarettes    Quit date: 06/22/2004  . Smokeless tobacco: Current User    Types: Chew  . Alcohol Use: Yes     twice a month      Review of Systems  Gastrointestinal: Negative for vomiting and bowel incontinence.  Genitourinary: Negative for hematuria.  Musculoskeletal: Positive for back pain.  Neurological: Negative for loss of consciousness, weakness and numbness.  All other systems reviewed and are  negative.    Allergies  Review of patient's allergies indicates no known allergies.  Home Medications   Current Outpatient Rx  Name Route Sig Dispense Refill  . HYDROCODONE-ACETAMINOPHEN 5-325 MG PO TABS Oral Take 1 tablet by mouth every 6 (six) hours as needed for pain. 20 tablet 0    BP 137/79  Pulse 73  Temp 98.4 F (36.9 C)  Resp 20  Ht 5\' 11"  (1.803 m)  Wt 190 lb (86.183 kg)  BMI 26.50 kg/m2  SpO2 98%  Physical Exam  Nursing note and vitals reviewed. Constitutional: He is oriented to person, place, and time. He appears well-developed and well-nourished.  HENT:  Head: Normocephalic and atraumatic.  Eyes: EOM are normal.  Neck: Normal range of motion.  Cardiovascular: Normal rate, regular rhythm, normal heart sounds and intact distal pulses.   Pulmonary/Chest: Effort normal and breath sounds normal. No respiratory distress.  Abdominal: Soft. He exhibits no distension. There is no tenderness.  Musculoskeletal: He exhibits tenderness.       Lumbar back: He exhibits decreased range of motion, tenderness, bony tenderness and pain. He exhibits no swelling, no laceration, no spasm and normal pulse.       Back:  Neurological: He is alert and oriented to person, place, and time. Coordination normal.  Skin: Skin is warm and dry.  Psychiatric: He has a normal mood and affect. Judgment normal.    ED Course  Procedures (including critical care time)  Labs  Reviewed - No data to display Dg Lumbar Spine Complete  05/12/2012  *RADIOLOGY REPORT*  Clinical Data:  fall, low back pain  LUMBAR SPINE - COMPLETE 4+ VIEW  Comparison: 04/04/2012  Findings: Normal lumbar spine alignment.  Preserved vertebral body heights and disc spaces.  Facets aligned.  Normal pedicles.  No pars defects.  Normal SI joints.  Nonobstructive bowel gas pattern.  IMPRESSION: No acute finding.   Original Report Authenticated By: Judie Petit. Ruel Favors, M.D.      1. Contusion of lower back       MDM  No  fx's  Ice Ibuprofen 800 mg TID rx-hydrocodone, 20 F/u with dr. Romeo Apple prn        Evalina Field, PA 05/12/12 1240

## 2012-05-15 NOTE — ED Provider Notes (Signed)
Medical screening examination/treatment/procedure(s) were performed by non-physician practitioner and as supervising physician I was immediately available for consultation/collaboration.  Donnetta Hutching, MD 05/15/12 [phone]

## 2012-08-28 ENCOUNTER — Emergency Department (HOSPITAL_COMMUNITY): Payer: BC Managed Care – PPO

## 2012-08-28 ENCOUNTER — Emergency Department (HOSPITAL_COMMUNITY)
Admission: EM | Admit: 2012-08-28 | Discharge: 2012-08-28 | Disposition: A | Discharge status: Home / Self Care | Payer: BC Managed Care – PPO | Attending: Emergency Medicine | Admitting: Emergency Medicine

## 2012-08-28 ENCOUNTER — Encounter (HOSPITAL_COMMUNITY): Payer: Self-pay | Admitting: *Deleted

## 2012-08-28 DIAGNOSIS — Z87891 Personal history of nicotine dependence: Secondary | ICD-10-CM | POA: Insufficient documentation

## 2012-08-28 DIAGNOSIS — N200 Calculus of kidney: Secondary | ICD-10-CM | POA: Insufficient documentation

## 2012-08-28 DIAGNOSIS — Z9889 Other specified postprocedural states: Secondary | ICD-10-CM | POA: Insufficient documentation

## 2012-08-28 LAB — BASIC METABOLIC PANEL
BUN: 10 mg/dL (ref 6–23)
Calcium: 9.6 mg/dL (ref 8.4–10.5)
GFR calc non Af Amer: >90 mL/min (ref >90)
Glucose, Bld: 100 mg/dL — ABNORMAL HIGH (ref 70–99)
Sodium: 138 mEq/L (ref 135–145)

## 2012-08-28 LAB — URINALYSIS, ROUTINE W REFLEX MICROSCOPIC
Bilirubin Urine: NEGATIVE
Ketones, ur: NEGATIVE mg/dL
Specific Gravity, Urine: 1.025 (ref 1.005–1.030)
Urobilinogen, UA: 0.2 mg/dL (ref 0.0–1.0)

## 2012-08-28 LAB — CBC WITH DIFFERENTIAL/PLATELET
Eosinophils Absolute: 0 10*3/uL (ref 0.0–0.7)
Eosinophils Relative: 0 % (ref 0–5)
Lymphs Abs: 2.1 10*3/uL (ref 0.7–4.0)
MCH: 29.7 pg (ref 26.0–34.0)
MCHC: 34.8 g/dL (ref 30.0–36.0)
MCV: 85.2 fL (ref 78.0–100.0)
Platelets: 274 10*3/uL (ref 150–400)
RBC: 4.92 MIL/uL (ref 4.22–5.81)

## 2012-08-28 LAB — URINE MICROSCOPIC-ADD ON

## 2012-08-28 MED ORDER — PROMETHAZINE HCL 25 MG PO TABS: 25.0000 mg | ORAL_TABLET | Freq: Four times a day (QID) | ORAL | Status: DC | PRN

## 2012-08-28 MED ORDER — OXYCODONE-ACETAMINOPHEN 5-325 MG PO TABS: 1.0000 | ORAL_TABLET | Freq: Four times a day (QID) | ORAL | Status: AC | PRN

## 2012-08-28 MED ORDER — ONDANSETRON HCL 4 MG/2ML IJ SOLN
4.0000 mg | Freq: Once | INTRAMUSCULAR | Status: AC
  Administered 2012-08-28: 4 mg via INTRAVENOUS
  Filled 2012-08-28: qty 2

## 2012-08-28 MED ORDER — HYDROMORPHONE HCL PF 1 MG/ML IJ SOLN
1.0000 mg | Freq: Once | INTRAMUSCULAR | Status: AC
  Administered 2012-08-28: 1 mg via INTRAVENOUS
  Filled 2012-08-28: qty 1

## 2012-08-28 MED ORDER — KETOROLAC TROMETHAMINE 30 MG/ML IJ SOLN
30.0000 mg | Freq: Once | INTRAMUSCULAR | Status: AC
  Administered 2012-08-28: 30 mg via INTRAVENOUS
  Filled 2012-08-28: qty 1

## 2012-08-28 NOTE — ED Provider Notes (Signed)
History    This chart was scribed for Christopher Lennert, MD, MD by Smitty Pluck, ED Scribe. The patient was seen in room APA12 and the patient's care was started at 2:16 PM.   CSN: [id number]  Arrival date & time 08/28/12  1218       Chief Complaint  Patient presents with  . Flank Pain    (Consider location/radiation/quality/duration/timing/severity/associated sxs/prior treatment) Patient is a 21 y.o. male presenting with flank pain. The history is provided by the patient. No language interpreter was used.  Flank Pain This is a recurrent problem. The current episode started 3 to 5 hours ago. The problem occurs constantly. The problem has not changed since onset.Pertinent negatives include no chest pain, no abdominal pain and no headaches. Nothing aggravates the symptoms. Nothing relieves the symptoms. He has tried nothing for the symptoms.   Christopher Walton is a 21 y.o. male who presents to the Emergency Department complaining of constant, moderate right flank pain radiating to groin onset today 4 hours ago. Pt reports hx of kidney stones (4 in total). He reports that he had kidney stone within past couple of weeks that he passed. Pt denies nausea, vomiting, hematuria and any other pain.    Past Medical History  Diagnosis Date  . Kidney stones     Past Surgical History  Procedure Date  . Kidney stone surgery     Family History  Problem Relation Age of Onset  . Hypertension Mother     History  Substance Use Topics  . Smoking status: Former Smoker -- 1.0 packs/day for 4 years    Types: Cigarettes    Quit date: 06/22/2004  . Smokeless tobacco: Current User    Types: Chew  . Alcohol Use: Yes     Comment: twice a month      Review of Systems  Constitutional: Negative for fatigue.  HENT: Negative for congestion, sinus pressure and ear discharge.   Eyes: Negative for discharge.  Respiratory: Negative for cough.   Cardiovascular: Negative for chest pain.    Gastrointestinal: Negative for nausea, abdominal pain and diarrhea.  Genitourinary: Positive for flank pain. Negative for frequency and hematuria.  Musculoskeletal: Negative for back pain.  Skin: Negative for rash.  Neurological: Negative for seizures and headaches.  Hematological: Negative.   Psychiatric/Behavioral: Negative for hallucinations.  All other systems reviewed and are negative.    Allergies  Review of patient's allergies indicates no known allergies.  Home Medications  No current outpatient prescriptions on file.  BP 141/96  Pulse 70  Temp 97.8 F (36.6 C) (Oral)  Resp 20  Ht 6' (1.829 m)  Wt 190 lb (86.183 kg)  BMI 25.77 kg/m2  SpO2 100%  Physical Exam  Nursing note and vitals reviewed. Constitutional: He is oriented to person, place, and time. He appears well-developed.  HENT:  Head: Normocephalic and atraumatic.  Eyes: Conjunctivae normal and EOM are normal. No scleral icterus.  Neck: Neck supple. No thyromegaly present.  Cardiovascular: Normal rate and regular rhythm.  Exam reveals no gallop and no friction rub.   No murmur heard. Pulmonary/Chest: No stridor. He has no wheezes. He has no rales. He exhibits no tenderness.  Abdominal: He exhibits no distension. There is no tenderness. There is no rebound.  Musculoskeletal: Normal range of motion. He exhibits no edema.  Lymphadenopathy:    He has no cervical adenopathy.  Neurological: He is oriented to person, place, and time. Coordination normal.  Skin: No rash noted. No erythema.  Psychiatric: He has a normal mood and affect. His behavior is normal.    ED Course  Procedures (including critical care time) DIAGNOSTIC STUDIES: Oxygen Saturation is 100% on room air, normal by my interpretation.    COORDINATION OF CARE: 2:19 PM Discussed ED treatment with pt  2:19 PM Ordered:   Medications  ketorolac (TORADOL) 30 MG/ML injection 30 mg (30 mg Intravenous Given 08/28/12 1438)  ondansetron (ZOFRAN)  injection 4 mg (4 mg Intravenous Given 08/28/12 1438)       Labs Reviewed  CBC WITH DIFFERENTIAL - Abnormal; Notable for the following:    WBC 11.6 (*)     Neutro Abs 8.7 (*)     All other components within normal limits  BASIC METABOLIC PANEL - Abnormal; Notable for the following:    Glucose, Bld 100 (*)     All other components within normal limits  URINALYSIS, ROUTINE W REFLEX MICROSCOPIC   Dg Abd 1 View  08/28/2012  *RADIOLOGY REPORT*  Clinical Data: Right-sided flank pain.  ABDOMEN - 1 VIEW  Comparison: CT of the abdomen and pelvis 08/16/2012.  Findings: There is a punctate calcification projecting to the right of the inferior endplate of L3, which likely represent the patient's known right ureteral calculus.  This appears slightly lower than on the recent prior examination from 08/16/2012.  No additional abnormal calcifications are seen projecting over either kidney or expected course of either ureter.  Gas and stool seen scattered throughout the colon extending to the level of the distal rectum.  No pathologic distension of small bowel is noted.  IMPRESSION: 1.  Punctate calculus in the right mid ureter appears to have slightly migrated compared to the prior CT scan 08/16/2012.   Original Report Authenticated By: Trudie Reed, M.D.      No diagnosis found.    MDM        The chart was scribed for me under my direct supervision.  I personally performed the history, physical, and medical decision making and all procedures in the evaluation of this patient.Christopher Lennert, MD 08/28/12 [phone]

## 2012-08-28 NOTE — ED Notes (Signed)
Attempted x2 without success for IV access

## 2012-08-28 NOTE — ED Notes (Signed)
Pt with right flank pain with mild nausea per pt, denies vomiting, hx of kidney stones, instructed pt to get an urine sample and pt verbalized understanding

## 2012-08-28 NOTE — ED Notes (Signed)
Pt states hx of kidney stones. Pain to right flank and groin area. Pt denies nausea or hematuria at this time. Pain began this morning.

## 2013-01-26 ENCOUNTER — Emergency Department (HOSPITAL_COMMUNITY)
Admission: EM | Admit: 2013-01-26 | Discharge: 2013-01-26 | Disposition: A | Discharge status: Home / Self Care | Payer: BC Managed Care – PPO | Attending: Emergency Medicine | Admitting: Emergency Medicine

## 2013-01-26 ENCOUNTER — Encounter (HOSPITAL_COMMUNITY): Payer: Self-pay

## 2013-01-26 DIAGNOSIS — H9209 Otalgia, unspecified ear: Secondary | ICD-10-CM | POA: Insufficient documentation

## 2013-01-26 DIAGNOSIS — Z87442 Personal history of urinary calculi: Secondary | ICD-10-CM | POA: Insufficient documentation

## 2013-01-26 DIAGNOSIS — Z87891 Personal history of nicotine dependence: Secondary | ICD-10-CM | POA: Insufficient documentation

## 2013-01-26 DIAGNOSIS — K0889 Other specified disorders of teeth and supporting structures: Secondary | ICD-10-CM

## 2013-01-26 DIAGNOSIS — R51 Headache: Secondary | ICD-10-CM | POA: Insufficient documentation

## 2013-01-26 DIAGNOSIS — K089 Disorder of teeth and supporting structures, unspecified: Secondary | ICD-10-CM | POA: Insufficient documentation

## 2013-01-26 MED ORDER — OXYCODONE-ACETAMINOPHEN 5-325 MG PO TABS: 1.0000 | ORAL_TABLET | Freq: Four times a day (QID) | ORAL | Status: DC | PRN

## 2013-01-26 NOTE — ED Provider Notes (Signed)
History    This chart was scribed for [employer]. Rubin Payor, MD by Quintella Reichert, ED scribe.  This patient was seen in room APA18/APA18 and the patient's care was started at 3:13 PM.   CSN: [id number]  Arrival date & time 01/26/13  1411      Chief Complaint  Patient presents with  . Dental Pain     The history is provided by the patient. No language interpreter was used.    HPI Comments: Christopher Walton is a 22 y.o. male who presents to the Emergency Department complaining of constant, gradual-onset, gradually-worsening, moderate pain to upper teeth and bottom molars.  Pt states pain is aggravated by eating.  He also reports mild associated ear pain and mild intermittent sinus headaches.  He denies swelling, bleeding, discharge, SOB, trouble swallowing, fever, chills, nausea, emesis, diarrhea, abdominal pain, urinary symptoms, weakness, numbness or any other associated symptoms.  Pt notes he has an appointment next week to have all upper teeth and bottom molars removed.  He has seen his dentist for pain in the past, and received pain medicine and antibiotics.  He states that pain medicine relieved pain somewhat.  He denies allergy medications.  Dentist is    Past Medical History  Diagnosis Date  . Kidney stones     Past Surgical History  Procedure Laterality Date  . Kidney stone surgery      Family History  Problem Relation Age of Onset  . Hypertension Mother     History  Substance Use Topics  . Smoking status: Former Smoker -- 1.00 packs/day for 4 years    Types: Cigarettes    Quit date: 06/22/2004  . Smokeless tobacco: Current User    Types: Chew  . Alcohol Use: Yes     Comment: twice a month      Review of Systems  Constitutional: Negative for fever and chills.  HENT: Positive for ear pain and dental problem. Negative for facial swelling and trouble swallowing.   Respiratory: Negative for shortness of breath.   Gastrointestinal: Negative for vomiting,  abdominal pain and diarrhea.  Genitourinary: Negative for dysuria and difficulty urinating.  Neurological: Positive for headaches. Negative for weakness and numbness.    Allergies  Review of patient's allergies indicates no known allergies.  Home Medications   Current Outpatient Rx  Name  Route  Sig  Dispense  Refill  . oxyCODONE-acetaminophen (PERCOCET/ROXICET) 5-325 MG per tablet   Oral   Take 1-2 tablets by mouth every 6 (six) hours as needed for pain.   20 tablet   0   . promethazine (PHENERGAN) 25 MG tablet   Oral   Take 1 tablet (25 mg total) by mouth every 6 (six) hours as needed for nausea.   15 tablet   0     BP 121/63  Pulse 74  Temp(Src) 98.1 F (36.7 C) (Oral)  Resp 20  Ht 6' (1.829 m)  Wt 190 lb (86.183 kg)  BMI 25.76 kg/m2  SpO2 100%  Physical Exam  Nursing note and vitals reviewed. Constitutional: He appears well-developed and well-nourished. No distress.  HENT:  Head: Normocephalic and atraumatic.  Mouth/Throat: Oropharynx is clear and moist. No oropharyngeal exudate.  Very poor dentition No tenderness over sinuses No fluctuance along gums Teeth much worse on top than on bottom  Eyes: Conjunctivae are normal.  Neck: Normal range of motion. Neck supple.  Cardiovascular: Normal rate, regular rhythm and normal heart sounds.   No murmur heard. Pulmonary/Chest: Effort normal and  breath sounds normal. No respiratory distress. He has no wheezes. He has no rales.  Neurological: He is alert. Coordination normal.  Skin: Skin is warm and dry.  Psychiatric: He has a normal mood and affect. His behavior is normal.    ED Course  Procedures (including critical care time)   COORDINATION OF CARE: 3:16 PM-Discussed treatment plan which includes percocet, f/u with dentist, and return to ED if new symptoms develop with pt at bedside and pt agreed to plan.      No results found.      1. Pain, dental       MDM  Patient with dental pain and very  poor dentition. Has an appointment to get his upper teeth removed. Has been on antibiotics and pain medicines. Will refill pain medicines. No sign of abscess and I do not feel he needs repeat antibiotics.     I personally performed the services described in this documentation, which was scribed in my presence. The recorded information has been reviewed and is accurate.    Juliet Rude. Rubin Payor, MD 01/26/13 [phone]

## 2013-01-26 NOTE — ED Notes (Signed)
Pt c/o dental pain x 1 week 

## 2013-03-16 ENCOUNTER — Emergency Department (HOSPITAL_COMMUNITY)
Admission: EM | Admit: 2013-03-16 | Discharge: 2013-03-16 | Disposition: A | Discharge status: Home / Self Care | Payer: BC Managed Care – PPO | Attending: Emergency Medicine | Admitting: Emergency Medicine

## 2013-03-16 ENCOUNTER — Encounter (HOSPITAL_COMMUNITY): Payer: Self-pay

## 2013-03-16 DIAGNOSIS — Z87891 Personal history of nicotine dependence: Secondary | ICD-10-CM | POA: Insufficient documentation

## 2013-03-16 DIAGNOSIS — M5410 Radiculopathy, site unspecified: Secondary | ICD-10-CM

## 2013-03-16 DIAGNOSIS — M2550 Pain in unspecified joint: Secondary | ICD-10-CM | POA: Insufficient documentation

## 2013-03-16 DIAGNOSIS — Z87442 Personal history of urinary calculi: Secondary | ICD-10-CM | POA: Insufficient documentation

## 2013-03-16 DIAGNOSIS — R209 Unspecified disturbances of skin sensation: Secondary | ICD-10-CM | POA: Insufficient documentation

## 2013-03-16 DIAGNOSIS — R5381 Other malaise: Secondary | ICD-10-CM | POA: Insufficient documentation

## 2013-03-16 DIAGNOSIS — M5412 Radiculopathy, cervical region: Secondary | ICD-10-CM | POA: Insufficient documentation

## 2013-03-16 MED ORDER — OXYCODONE-ACETAMINOPHEN 5-325 MG PO TABS: 1.0000 | ORAL_TABLET | ORAL | Status: DC | PRN

## 2013-03-16 MED ORDER — OXYCODONE-ACETAMINOPHEN 5-325 MG PO TABS
1.0000 | ORAL_TABLET | Freq: Once | ORAL | Status: AC
  Administered 2013-03-16: 1 via ORAL
  Filled 2013-03-16: qty 1

## 2013-03-16 NOTE — ED Notes (Signed)
Pt states came here for relief of left hand pain, tingling and burning in said extremity.  Pt reports being seen at [facility] but was only given a referral. Pt reports unable to use his hand to " do anything". Pt able to wiggle fingers. Radial pulse present, cap refill WNL. Hand is warm to touch, and pink in color. Pt also has noted rash, to which he was treated for scabies during same visit. Pt reports having the rash x 2.5 months.

## 2013-03-16 NOTE — ED Notes (Signed)
Pt c/o left hand pain since Friday.    Denies injury.  Reports went to [facility] and was told it could be a pinched nerve.  Pt can wiggle fingers.  No swelling noted.  Pt unable to make a fist due to pain.  Reports pain intermittently shoots up left forearm.

## 2013-03-16 NOTE — ED Provider Notes (Signed)
History    CSN: [id number] Arrival date & time 03/16/13  1602  First MD Initiated Contact with Patient 03/16/13 1644     Chief Complaint  Patient presents with  . Hand Pain   (Consider location/radiation/quality/duration/timing/severity/associated sxs/prior Treatment) HPI Comments: Christopher Walton is a 22 y.o. Male presenting with a 5 day history of  left hand tingling, burning pain and weakness in his thumb, index and long finger. He woke with these symptoms and denies pain or injury the day before,  Although does recall having a brief episode of a left neck muscle spasm while dragging brush the day before which resolved spontaneously.  He was seen at [facility] for this 5 days ago and was told he probably had a "pinched nerve".  He has taken no medicines for his symptoms. He was referred to a doctor in [place] which he is scheduled to see, but not until mid August (does not know the doctors name or the specialty as his mom made the appt).  He is concerned about waiting that long given the weakness in his hand.  He denies any etoh use the day before he woke with these symptoms.    The history is provided by the patient.   Past Medical History  Diagnosis Date  . Kidney stones    Past Surgical History  Procedure Laterality Date  . Kidney stone surgery     Family History  Problem Relation Age of Onset  . Hypertension Mother    History  Substance Use Topics  . Smoking status: Former Smoker -- 1.00 packs/day for 4 years    Types: Cigarettes    Quit date: 06/22/2004  . Smokeless tobacco: Current User    Types: Chew  . Alcohol Use: Yes     Comment: twice a month    Review of Systems  Constitutional: Negative for fever.  Musculoskeletal: Positive for arthralgias. Negative for myalgias and joint swelling.  Neurological: Positive for weakness and numbness.    Allergies  Review of patient's allergies indicates no known allergies.  Home Medications   Current Outpatient Rx   Name  Route  Sig  Dispense  Refill  . oxyCODONE-acetaminophen (PERCOCET/ROXICET) 5-325 MG per tablet   Oral   Take 1 tablet by mouth every 4 (four) hours as needed for pain.   20 tablet   0    BP 126/78  Pulse 78  Temp(Src) 97.4 F (36.3 C) (Oral)  Resp 20  Ht 6' (1.829 m)  Wt 180 lb (81.647 kg)  BMI 24.41 kg/m2  SpO2 100% Physical Exam  Constitutional: He appears well-developed and well-nourished.  HENT:  Head: Atraumatic.  Neck: Normal range of motion. Muscular tenderness present.    Cardiovascular:  Pulses equal bilaterally  Musculoskeletal: He exhibits tenderness. He exhibits no edema.       Right hand: He exhibits disruption of two-point discrimination. He exhibits normal capillary refill and no swelling. Decreased sensation noted. Decreased sensation is present in the radial distribution. Decreased strength noted. He exhibits finger abduction and thumb/finger opposition. He exhibits no wrist extension trouble.  No visible or palpable deformity left hand.  He has increased pain with movement of his fingers,  But not with palpation.  nontender at left elbow and shoulder, pt display FROM at these joints.  No forearm edema.  Neurological: He is alert. He displays normal reflexes. A sensory deficit is present.  Left thumb, index and long finger (lateral long finger with abnormal sensation, but "better" the medial  side. Negative tinels.  Skin: Skin is warm and dry.  Psychiatric: He has a normal mood and affect.    ED Course  Procedures (including critical care time) Labs Reviewed - No data to display No results found. 1. 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  MRI scheduled for Friday,  In 4 days.  Offered MRI tonight but pt unwilling to stay for this study. Advised pt that given sx,  The sooner he has this study the better possible outcome, waiting can  affect his long term outcome.  Pt understands.  He has to be home when his kids are dropped off,  Cannot wait.  If cervical pathology found needs referral to neurosurgeon,  If not,  May require nerve conduction study to assess for carpal tunnel or other radial nerve injury,  Consider referral to local neurologist.  Pt prescribed oxycodone for pain.  Burgess Amor, PA-C 03/17/13 1402  Burgess Amor, PA-C 03/17/13 [phone]

## 2013-03-17 NOTE — ED Provider Notes (Signed)
Medical screening examination/treatment/procedure(s) were performed by non-physician practitioner and as supervising physician I was immediately available for consultation/collaboration.   Icess Bertoni L Chidi Shirer, MD 03/17/13 1624 

## 2013-03-19 ENCOUNTER — Ambulatory Visit (HOSPITAL_COMMUNITY)
Admit: 2013-03-19 | Discharge: 2013-03-19 | Disposition: A | Discharge status: Home / Self Care | Payer: BC Managed Care – PPO | Source: Ambulatory Visit | Attending: Emergency Medicine | Admitting: Emergency Medicine

## 2013-03-19 DIAGNOSIS — R209 Unspecified disturbances of skin sensation: Secondary | ICD-10-CM | POA: Insufficient documentation

## 2013-03-19 DIAGNOSIS — M5030 Other cervical disc degeneration, unspecified cervical region: Secondary | ICD-10-CM | POA: Insufficient documentation

## 2013-03-23 ENCOUNTER — Other Ambulatory Visit (HOSPITAL_COMMUNITY): Payer: Self-pay | Admitting: Emergency Medicine

## 2013-03-28 ENCOUNTER — Emergency Department (HOSPITAL_COMMUNITY)
Admission: EM | Admit: 2013-03-28 | Discharge: 2013-03-28 | Disposition: A | Discharge status: Home / Self Care | Payer: BC Managed Care – PPO | Attending: Emergency Medicine | Admitting: Emergency Medicine

## 2013-03-28 DIAGNOSIS — M5412 Radiculopathy, cervical region: Secondary | ICD-10-CM | POA: Insufficient documentation

## 2013-03-28 DIAGNOSIS — Z87891 Personal history of nicotine dependence: Secondary | ICD-10-CM | POA: Insufficient documentation

## 2013-03-28 DIAGNOSIS — Z87442 Personal history of urinary calculi: Secondary | ICD-10-CM | POA: Insufficient documentation

## 2013-03-28 MED ORDER — OXYCODONE-ACETAMINOPHEN 5-325 MG PO TABS: 2.0000 | ORAL_TABLET | ORAL | Status: DC | PRN

## 2013-03-28 MED ORDER — NAPROXEN 500 MG PO TABS: 500.0000 mg | ORAL_TABLET | Freq: Two times a day (BID) | ORAL | Status: DC

## 2013-03-28 MED ORDER — OXYCODONE-ACETAMINOPHEN 5-325 MG PO TABS
1.0000 | ORAL_TABLET | Freq: Once | ORAL | Status: AC
  Administered 2013-03-28: 1 via ORAL
  Filled 2013-03-28: qty 1

## 2013-03-28 NOTE — ED Notes (Signed)
States that he is having pain in his left pain, states that he recently had a MRI and has not heard the results back.  States that he is here today for pain control.

## 2013-03-28 NOTE — ED Provider Notes (Signed)
CSN: [id number]     Arrival date & time 03/28/13  1340 History     First MD Initiated Contact with Patient 03/28/13 1357     Chief Complaint  Patient presents with  . Numbness  . Tingling   (Consider location/radiation/quality/duration/timing/severity/associated sxs/prior Treatment) HPI Christopher Walton is a 22 y.o. male who presents to the ED with pain. The pain is located in the left arm and radiates to the fingers. He had MR of neck due to the pain. The MR was scheduled through the ED. He was to call back for the results but could not get in touch with anyone who could give him results. He is not sure who to follow up with. He is here for pain management until his follow up. He was given Percocet on his last visit 7/15 but ran out and the pain is back.   Past Medical History  Diagnosis Date  . Kidney stones    Past Surgical History  Procedure Laterality Date  . Kidney stone surgery     Family History  Problem Relation Age of Onset  . Hypertension Mother    History  Substance Use Topics  . Smoking status: Former Smoker -- 1.00 packs/day for 4 years    Types: Cigarettes    Quit date: 06/22/2004  . Smokeless tobacco: Current User    Types: Chew  . Alcohol Use: Yes     Comment: twice a month    Review of Systems  Constitutional: Negative for fever and chills.  HENT: Neck pain: occasionally    Respiratory: Negative for shortness of breath.   Gastrointestinal: Negative for nausea and vomiting.  Skin: Negative for wound.  Neurological: Positive for numbness (left fingers).  Psychiatric/Behavioral: The patient is not nervous/anxious.     Allergies  Review of patient's allergies indicates no known allergies.  Home Medications   Current Outpatient Rx  Name  Route  Sig  Dispense  Refill  . ibuprofen (ADVIL,MOTRIN) 200 MG tablet   Oral   Take 200 mg by mouth every 6 (six) hours as needed for pain.         . naproxen (NAPROSYN) 500 MG tablet   Oral   Take 1 tablet  (500 mg total) by mouth 2 (two) times daily.   30 tablet   0   . oxyCODONE-acetaminophen (PERCOCET/ROXICET) 5-325 MG per tablet   Oral   Take 2 tablets by mouth every 4 (four) hours as needed for pain.   15 tablet   0    BP 127/65  Pulse 75  Temp(Src) 98.2 F (36.8 C) (Oral)  Resp 14  SpO2 99% Physical Exam  Nursing note and vitals reviewed. Constitutional: He is oriented to person, place, and time. He appears well-developed and well-nourished. No distress.  HENT:  Head: Normocephalic.  Eyes: EOM are normal.  Neck: Neck supple.  Cardiovascular: Normal rate.   Pulmonary/Chest: Effort normal.  Musculoskeletal:       Left hand: He exhibits decreased range of motion and tenderness. Decreased sensation noted.       Hands: Pain with palpation and movement of thumb, index and middle fingers. Slightly decreased strength and sensation.  Neurological: He is alert and oriented to person, place, and time. No cranial nerve deficit.  Skin: Skin is warm and dry.  Psychiatric: He has a normal mood and affect. His behavior is normal.    ED Course   Procedures   MDM  22 y.o. male with pain in left hand possibly  due to cervical disc problem. MR results reviewed with Dr. Lajean Saver and copy of report given to the patient. Referral to Dr. Gerilyn Pilgrim and pain management until follow up.  I have reviewed this patient's vital signs, nurses notes, appropriate labs and imaging.  I have discussed findings with the patient and plan of care and he voices understanding.    Medication List    TAKE these medications       naproxen 500 MG tablet  Commonly known as:  NAPROSYN  Take 1 tablet (500 mg total) by mouth 2 (two) times daily.     oxyCODONE-acetaminophen 5-325 MG per tablet  Commonly known as:  PERCOCET/ROXICET  Take 2 tablets by mouth every 4 (four) hours as needed for pain.      ASK your doctor about these medications       ibuprofen 200 MG tablet  Commonly known as:  ADVIL,MOTRIN  Take  200 mg by mouth every 6 (six) hours as needed for pain.         [place], [place] 03/28/13 [phone]

## 2013-03-28 NOTE — ED Provider Notes (Signed)
Medical screening examination/treatment/procedure(s) were performed by non-physician practitioner and as supervising physician I was immediately available for consultation/collaboration.  MRI results d/w Dr. Gerilyn Pilgrim who will see in followup.  Glynn Octave, MD 03/28/13 [phone]

## 2013-04-22 ENCOUNTER — Encounter (HOSPITAL_COMMUNITY): Payer: Self-pay | Admitting: Emergency Medicine

## 2013-04-22 ENCOUNTER — Emergency Department (HOSPITAL_COMMUNITY)
Admission: EM | Admit: 2013-04-22 | Discharge: 2013-04-22 | Disposition: A | Discharge status: Home / Self Care | Payer: BC Managed Care – PPO | Attending: Emergency Medicine | Admitting: Emergency Medicine

## 2013-04-22 DIAGNOSIS — W108XXA Fall (on) (from) other stairs and steps, initial encounter: Secondary | ICD-10-CM | POA: Insufficient documentation

## 2013-04-22 DIAGNOSIS — M542 Cervicalgia: Secondary | ICD-10-CM

## 2013-04-22 DIAGNOSIS — S199XXA Unspecified injury of neck, initial encounter: Secondary | ICD-10-CM | POA: Insufficient documentation

## 2013-04-22 DIAGNOSIS — Z79899 Other long term (current) drug therapy: Secondary | ICD-10-CM | POA: Insufficient documentation

## 2013-04-22 DIAGNOSIS — Y929 Unspecified place or not applicable: Secondary | ICD-10-CM | POA: Insufficient documentation

## 2013-04-22 DIAGNOSIS — Z87442 Personal history of urinary calculi: Secondary | ICD-10-CM | POA: Insufficient documentation

## 2013-04-22 DIAGNOSIS — Z87891 Personal history of nicotine dependence: Secondary | ICD-10-CM | POA: Insufficient documentation

## 2013-04-22 DIAGNOSIS — Y9301 Activity, walking, marching and hiking: Secondary | ICD-10-CM | POA: Insufficient documentation

## 2013-04-22 DIAGNOSIS — S0993XA Unspecified injury of face, initial encounter: Secondary | ICD-10-CM | POA: Insufficient documentation

## 2013-04-22 MED ORDER — HYDROCODONE-ACETAMINOPHEN 5-325 MG PO TABS: 2.0000 | ORAL_TABLET | ORAL | Status: DC | PRN

## 2013-04-22 MED ORDER — IBUPROFEN 800 MG PO TABS: 800.0000 mg | ORAL_TABLET | Freq: Three times a day (TID) | ORAL | Status: DC

## 2013-04-22 MED ORDER — HYDROCODONE-ACETAMINOPHEN 5-325 MG PO TABS
2.0000 | ORAL_TABLET | Freq: Once | ORAL | Status: AC
  Administered 2013-04-22: 2 via ORAL
  Filled 2013-04-22: qty 2

## 2013-04-22 MED ORDER — METHOCARBAMOL 500 MG PO TABS: 500.0000 mg | ORAL_TABLET | Freq: Two times a day (BID) | ORAL | Status: DC

## 2013-04-22 NOTE — ED Provider Notes (Signed)
CSN: [id number]     Arrival date & time 04/22/13  1430 History     First MD Initiated Contact with Patient 04/22/13 1516     Chief Complaint  Patient presents with  . Fall   HPI   Christopher Walton has a history of neck pain over the last several weeks. 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 His appointment to see her local neurologist next Tuesday. He had intermittent episodes of numbness in his left first and third digits. He has not had this for several weeks. He was walking down a set of steps today, he missed the first of. He went on to his back and slid down an additional 4 stairs. He did not actually strike his head or his neck. He has an exacerbation of pain in his neck. He denies any symptoms in his arms forearms or hands. He is no numbness weakness or tingling. Pain is the neck and to his left shoulder. No strike her head no loss of consciousness areas of pain or concern or injury. He has no neurological symptoms in the legs now nor has he over the last several weeks. He denies any Lhermitte's phenomenon at his neck or back or arm with movement. Past Medical History  Diagnosis Date  . Kidney stones    Past Surgical History  Procedure Laterality Date  . Kidney stone surgery     Family History  Problem Relation Age of Onset  . Hypertension Mother    History  Substance Use Topics  . Smoking status: Former Smoker -- 1.00 packs/day for 4 years    Types: Cigarettes    Quit date: 06/22/2004  . Smokeless tobacco: Current User    Types: Chew  . Alcohol Use: Yes     Comment: twice a month    Review of Systems  Constitutional: Negative for fever, chills, diaphoresis, appetite change and fatigue.  HENT: Negative for sore throat, mouth sores and trouble swallowing.   Eyes: Negative for visual disturbance.  Respiratory: Negative for cough, chest tightness, shortness of breath and wheezing.   Cardiovascular: Negative for chest pain.  Gastrointestinal:  Negative for nausea, vomiting, abdominal pain, diarrhea and abdominal distention.  Endocrine: Negative for polydipsia, polyphagia and polyuria.  Genitourinary: Negative for dysuria, frequency and hematuria.  Musculoskeletal: Positive for back pain and arthralgias. Negative for gait problem.  Skin: Negative for color change, pallor and rash.  Neurological: Negative for dizziness, syncope, weakness, light-headedness, numbness and headaches.  Hematological: Does not bruise/bleed easily.  Psychiatric/Behavioral: Negative for behavioral problems and confusion.    Allergies  Review of patient's allergies indicates no known allergies.  Home Medications   Current Outpatient Rx  Name  Route  Sig  Dispense  Refill  . HYDROcodone-acetaminophen (NORCO/VICODIN) 5-325 MG per tablet   Oral   Take 2 tablets by mouth every 4 (four) hours as needed for pain.   16 tablet   0   . ibuprofen (ADVIL,MOTRIN) 200 MG tablet   Oral   Take 200 mg by mouth every 6 (six) hours as needed for pain.         Marland Kitchen ibuprofen (ADVIL,MOTRIN) 800 MG tablet   Oral   Take 1 tablet (800 mg total) by mouth 3 (three) times daily.   21 tablet   0   . methocarbamol (ROBAXIN) 500 MG tablet   Oral   Take 1 tablet (500 mg total) by mouth 2 (two) times daily.   20 tablet  0    BP 128/71  Pulse 76  Temp(Src) 97.7 F (36.5 C) (Oral)  Resp 17  SpO2 100% Physical Exam  Constitutional: He is oriented to person, place, and time. He appears well-developed and well-nourished. No distress.  HENT:  Head: Normocephalic.  Eyes: Conjunctivae are normal. Pupils are equal, round, and reactive to light. No scleral icterus.  Neck: Normal range of motion. Neck supple. No thyromegaly present.  Cardiovascular: Normal rate and regular rhythm.  Exam reveals no gallop and no friction rub.   No murmur heard. Pulmonary/Chest: Effort normal and breath sounds normal. No respiratory distress. He has no wheezes. He has no rales.   Abdominal: Soft. Bowel sounds are normal. He exhibits no distension. There is no tenderness. There is no rebound.  Musculoskeletal: Normal range of motion.  Neurological: He is alert and oriented to person, place, and time.  Neurological exam. With flexion extension of the neck he has no symptoms down his arms. His main negative Spurling's sign. No Lhermitte's phenomenon. He has normal shoulder shrug normal flexion extension at the elbows normal flexion extension at the wrist normal at ejection the fingers normal grip strength. Normal sensation throughout the bilateral upper extremities. He has normal gait normal heel stance normal toe stand.  Normal DTRs to UE and LE symmetric and bilateral.  Skin: Skin is warm and dry. No rash noted.  Psychiatric: He has a normal mood and affect. His behavior is normal.    ED Course   Procedures (including critical care time)  Labs Reviewed - No data to display No results found. 1. Neck pain     MDM  No signs of acute cervical radicular radiculopathy. No signs of acute myelopathy. His pain seems musculoskeletal per history and exam. Plan anti-inflammatories pain medicines muscle relaxants continue with this plan followup with urology in 4 days  Claudean Kinds, MD 04/22/13 [phone]

## 2013-04-22 NOTE — ED Notes (Signed)
Pt "slipped" down approx 8 steps. 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 Pt only hit bottom and lower back. Pt c/o pain to neck and across shoulders from chronic pain. Denies hitting head. nad

## 2013-07-31 ENCOUNTER — Emergency Department (HOSPITAL_COMMUNITY): Payer: BC Managed Care – PPO

## 2013-07-31 ENCOUNTER — Encounter (HOSPITAL_COMMUNITY): Payer: Self-pay | Admitting: Emergency Medicine

## 2013-07-31 ENCOUNTER — Emergency Department (HOSPITAL_COMMUNITY)
Admission: EM | Admit: 2013-07-31 | Discharge: 2013-07-31 | Discharge status: Against Medical Advice | Payer: BC Managed Care – PPO | Attending: Emergency Medicine | Admitting: Emergency Medicine

## 2013-07-31 DIAGNOSIS — Y92009 Unspecified place in unspecified non-institutional (private) residence as the place of occurrence of the external cause: Secondary | ICD-10-CM | POA: Insufficient documentation

## 2013-07-31 DIAGNOSIS — W19XXXA Unspecified fall, initial encounter: Secondary | ICD-10-CM

## 2013-07-31 DIAGNOSIS — S199XXA Unspecified injury of neck, initial encounter: Secondary | ICD-10-CM | POA: Insufficient documentation

## 2013-07-31 DIAGNOSIS — M542 Cervicalgia: Secondary | ICD-10-CM

## 2013-07-31 DIAGNOSIS — Z87442 Personal history of urinary calculi: Secondary | ICD-10-CM | POA: Insufficient documentation

## 2013-07-31 DIAGNOSIS — S0993XA Unspecified injury of face, initial encounter: Secondary | ICD-10-CM | POA: Insufficient documentation

## 2013-07-31 DIAGNOSIS — Y9389 Activity, other specified: Secondary | ICD-10-CM | POA: Insufficient documentation

## 2013-07-31 DIAGNOSIS — W108XXA Fall (on) (from) other stairs and steps, initial encounter: Secondary | ICD-10-CM | POA: Insufficient documentation

## 2013-07-31 DIAGNOSIS — M545 Low back pain, unspecified: Secondary | ICD-10-CM

## 2013-07-31 DIAGNOSIS — Z87891 Personal history of nicotine dependence: Secondary | ICD-10-CM | POA: Insufficient documentation

## 2013-07-31 DIAGNOSIS — IMO0002 Reserved for concepts with insufficient information to code with codable children: Secondary | ICD-10-CM | POA: Insufficient documentation

## 2013-07-31 MED ORDER — IBUPROFEN 800 MG PO TABS
800.0000 mg | ORAL_TABLET | Freq: Once | ORAL | Status: AC
  Administered 2013-07-31: 800 mg via ORAL
  Filled 2013-07-31: qty 1

## 2013-07-31 NOTE — ED Notes (Signed)
Patient is not in the room, gown is on the bed. Pharmacy tech states she saw him leaving.

## 2013-07-31 NOTE — ED Notes (Signed)
Pt slipped down 6 steps this morning, fell backward denies any loc, now having neck and low back pain. Was holding his child when he fell.

## 2013-08-03 NOTE — ED Provider Notes (Signed)
CSN: [id number]     Arrival date & time 07/31/13  1118 History   First MD Initiated Contact with Patient 07/31/13 1137     Chief Complaint  Patient presents with  . Fall   (Consider location/radiation/quality/duration/timing/severity/associated sxs/prior Treatment) HPI Comments: Christopher Walton is a 22 y.o. Male presenting with pain in his neck and his lower back after tripping and falling down a 6 step stoop at his home prior to arrival.  He was carrying his child who is without injury.  He landed against one of the step edges against his lower back and reports localized pain at this site.  He denies numbness, weakness or radiation of pain into his extremities.  Pain is constant, but worse with movement and palpation.  He has taken no medicines prior to arrival.     The history is provided by the patient.    Past Medical History  Diagnosis Date  . Kidney stones    Past Surgical History  Procedure Laterality Date  . Kidney stone surgery     Family History  Problem Relation Age of Onset  . Hypertension Mother    History  Substance Use Topics  . Smoking status: Former Smoker -- 1.00 packs/day for 4 years    Types: Cigarettes    Quit date: 06/22/2004  . Smokeless tobacco: Current User    Types: Chew  . Alcohol Use: Yes     Comment: twice a month    Review of Systems  Constitutional: Negative for fever.  Respiratory: Negative for shortness of breath.   Cardiovascular: Negative for chest pain and leg swelling.  Gastrointestinal: Negative for abdominal pain, constipation and abdominal distention.  Genitourinary: Negative for dysuria, urgency, frequency, flank pain and difficulty urinating.  Musculoskeletal: Positive for back pain and neck pain. Negative for gait problem and joint swelling.  Skin: Negative for rash.  Neurological: Negative for weakness and numbness.    Allergies  Review of patient's allergies indicates no known allergies.  Home Medications   Current  Outpatient Rx  Name  Route  Sig  Dispense  Refill  . HYDROcodone-acetaminophen (NORCO/VICODIN) 5-325 MG per tablet   Oral   Take 2 tablets by mouth every 4 (four) hours as needed for pain.   16 tablet   0   . ibuprofen (ADVIL,MOTRIN) 200 MG tablet   Oral   Take 200 mg by mouth every 6 (six) hours as needed for pain.         Marland Kitchen ibuprofen (ADVIL,MOTRIN) 800 MG tablet   Oral   Take 1 tablet (800 mg total) by mouth 3 (three) times daily.   21 tablet   0   . methocarbamol (ROBAXIN) 500 MG tablet   Oral   Take 1 tablet (500 mg total) by mouth 2 (two) times daily.   20 tablet   0    BP 130/81  Pulse 80  Temp(Src) 98 F (36.7 C)  Resp 20  Ht 6' (1.829 m)  Wt 190 lb (86.183 kg)  BMI 25.76 kg/m2  SpO2 100% Physical Exam  Nursing note and vitals reviewed. Constitutional: He appears well-developed and well-nourished.  HENT:  Head: Normocephalic.  Eyes: Conjunctivae are normal.  Neck: Normal range of motion. Neck supple.  Cardiovascular: Normal rate and intact distal pulses.   Pedal pulses normal.  Pulmonary/Chest: Effort normal.  Abdominal: Soft. Bowel sounds are normal. He exhibits no distension and no mass.  Musculoskeletal: Normal range of motion. He exhibits tenderness. He exhibits no edema.  Cervical back: He exhibits tenderness. He exhibits normal range of motion, no bony tenderness, no swelling, no edema, no deformity and no spasm.       Lumbar back: He exhibits tenderness and bony tenderness. He exhibits no swelling, no edema, no deformity and no spasm.  Neurological: He is alert. He has normal strength. He displays no atrophy and no tremor. No sensory deficit. Gait normal.  Reflex Scores:      Patellar reflexes are 2+ on the right side and 2+ on the left side.      Achilles reflexes are 2+ on the right side and 2+ on the left side. No strength deficit noted in hip and knee flexor and extensor muscle groups.  Ankle flexion and extension intact.  Skin: Skin is  warm and dry.  Psychiatric: He has a normal mood and affect.    ED Course  Procedures (including critical care time) Labs Review Labs Reviewed - No data to display Imaging Review No results found.  EKG Interpretation   None       MDM   1. Fall, initial encounter   2. Low back pain   3. Cervical pain (neck)    Ordered xrays for this patient. Left ama prior to having these xrays performed.  He did not inform staff of departure.      Burgess Amor, PA-C 08/03/13 1326

## 2013-08-06 ENCOUNTER — Encounter (HOSPITAL_COMMUNITY): Payer: Self-pay | Admitting: Emergency Medicine

## 2013-08-06 ENCOUNTER — Emergency Department (HOSPITAL_COMMUNITY)
Admission: EM | Admit: 2013-08-06 | Discharge: 2013-08-06 | Discharge status: Against Medical Advice | Payer: BC Managed Care – PPO | Attending: Emergency Medicine | Admitting: Emergency Medicine

## 2013-08-06 DIAGNOSIS — R109 Unspecified abdominal pain: Secondary | ICD-10-CM | POA: Insufficient documentation

## 2013-08-06 DIAGNOSIS — R1110 Vomiting, unspecified: Secondary | ICD-10-CM | POA: Insufficient documentation

## 2013-08-06 DIAGNOSIS — Z87442 Personal history of urinary calculi: Secondary | ICD-10-CM | POA: Insufficient documentation

## 2013-08-06 NOTE — ED Notes (Signed)
Pt not in waiting room

## 2013-08-06 NOTE — ED Notes (Signed)
Pt c/o bilateral flank pain and pain in r groin since yesterday.  Reports vomited x 1 around 2 am.   Reports has history of kidney stones.

## 2013-08-10 NOTE — ED Provider Notes (Signed)
Medical screening examination/treatment/procedure(s) were performed by non-physician practitioner and as supervising physician I was immediately available for consultation/collaboration.     Geoffery Lyons, MD 08/10/13 1539

## 2014-03-18 ENCOUNTER — Emergency Department (HOSPITAL_COMMUNITY): Payer: BC Managed Care – PPO

## 2014-03-18 ENCOUNTER — Encounter (HOSPITAL_COMMUNITY): Payer: Self-pay | Admitting: Emergency Medicine

## 2014-03-18 ENCOUNTER — Emergency Department (HOSPITAL_COMMUNITY)
Admission: EM | Admit: 2014-03-18 | Discharge: 2014-03-18 | Disposition: A | Discharge status: Home / Self Care | Payer: BC Managed Care – PPO | Attending: Emergency Medicine | Admitting: Emergency Medicine

## 2014-03-18 DIAGNOSIS — Y939 Activity, unspecified: Secondary | ICD-10-CM | POA: Insufficient documentation

## 2014-03-18 DIAGNOSIS — Z87891 Personal history of nicotine dependence: Secondary | ICD-10-CM | POA: Insufficient documentation

## 2014-03-18 DIAGNOSIS — Y929 Unspecified place or not applicable: Secondary | ICD-10-CM | POA: Insufficient documentation

## 2014-03-18 DIAGNOSIS — S161XXA Strain of muscle, fascia and tendon at neck level, initial encounter: Secondary | ICD-10-CM

## 2014-03-18 DIAGNOSIS — IMO0002 Reserved for concepts with insufficient information to code with codable children: Secondary | ICD-10-CM | POA: Insufficient documentation

## 2014-03-18 DIAGNOSIS — Z87442 Personal history of urinary calculi: Secondary | ICD-10-CM | POA: Insufficient documentation

## 2014-03-18 DIAGNOSIS — J40 Bronchitis, not specified as acute or chronic: Secondary | ICD-10-CM | POA: Insufficient documentation

## 2014-03-18 DIAGNOSIS — W19XXXA Unspecified fall, initial encounter: Secondary | ICD-10-CM

## 2014-03-18 DIAGNOSIS — J029 Acute pharyngitis, unspecified: Secondary | ICD-10-CM | POA: Insufficient documentation

## 2014-03-18 DIAGNOSIS — S139XXA Sprain of joints and ligaments of unspecified parts of neck, initial encounter: Secondary | ICD-10-CM | POA: Insufficient documentation

## 2014-03-18 DIAGNOSIS — W108XXA Fall (on) (from) other stairs and steps, initial encounter: Secondary | ICD-10-CM | POA: Insufficient documentation

## 2014-03-18 LAB — COMPREHENSIVE METABOLIC PANEL
ALT: 88 U/L — ABNORMAL HIGH (ref 0–53)
AST: 57 U/L — AB (ref 0–37)
Albumin: 3.9 g/dL (ref 3.5–5.2)
Alkaline Phosphatase: 124 U/L — ABNORMAL HIGH (ref 39–117)
Anion gap: 9 (ref 5–15)
BUN: 8 mg/dL (ref 6–23)
CALCIUM: 9.7 mg/dL (ref 8.4–10.5)
CO2: 32 meq/L (ref 19–32)
CREATININE: 0.71 mg/dL (ref 0.50–1.35)
Chloride: 101 mEq/L (ref 96–112)
GFR calc Af Amer: >90 mL/min (ref >90)
GFR calc non Af Amer: >90 mL/min (ref >90)
Glucose, Bld: 87 mg/dL (ref 70–99)
Potassium: 4.3 mEq/L (ref 3.7–5.3)
SODIUM: 142 meq/L (ref 137–147)
TOTAL PROTEIN: 7.5 g/dL (ref 6.0–8.3)
Total Bilirubin: 0.5 mg/dL (ref 0.3–1.2)

## 2014-03-18 LAB — CBC WITH DIFFERENTIAL/PLATELET
Basophils Absolute: 0 10*3/uL (ref 0.0–0.1)
Basophils Relative: 0 % (ref 0–1)
Eosinophils Absolute: 0.2 10*3/uL (ref 0.0–0.7)
Eosinophils Relative: 1 % (ref 0–5)
HCT: 39.8 % (ref 39.0–52.0)
Hemoglobin: 13.7 g/dL (ref 13.0–17.0)
LYMPHS PCT: 17 % (ref 12–46)
Lymphs Abs: 2.1 10*3/uL (ref 0.7–4.0)
MCH: 29.3 pg (ref 26.0–34.0)
MCHC: 34.4 g/dL (ref 30.0–36.0)
MCV: 85.2 fL (ref 78.0–100.0)
Monocytes Absolute: 0.8 10*3/uL (ref 0.1–1.0)
Monocytes Relative: 6 % (ref 3–12)
Neutro Abs: 9.4 10*3/uL — ABNORMAL HIGH (ref 1.7–7.7)
Neutrophils Relative %: 76 % (ref 43–77)
PLATELETS: 298 10*3/uL (ref 150–400)
RBC: 4.67 MIL/uL (ref 4.22–5.81)
RDW: 12.7 % (ref 11.5–15.5)
WBC: 12.5 10*3/uL — AB (ref 4.0–10.5)

## 2014-03-18 MED ORDER — OXYCODONE-ACETAMINOPHEN 5-325 MG PO TABS: 1.0000 | ORAL_TABLET | ORAL | Status: DC | PRN

## 2014-03-18 MED ORDER — NAPROXEN 500 MG PO TABS: 500.0000 mg | ORAL_TABLET | Freq: Two times a day (BID) | ORAL | Status: DC

## 2014-03-18 MED ORDER — AZITHROMYCIN 250 MG PO TABS: 250.0000 mg | ORAL_TABLET | Freq: Every day | ORAL | Status: DC

## 2014-03-18 MED ORDER — CYCLOBENZAPRINE HCL 10 MG PO TABS
10.0000 mg | ORAL_TABLET | Freq: Once | ORAL | Status: AC
  Administered 2014-03-18: 10 mg via ORAL
  Filled 2014-03-18: qty 1

## 2014-03-18 MED ORDER — OXYCODONE-ACETAMINOPHEN 5-325 MG PO TABS
1.0000 | ORAL_TABLET | Freq: Once | ORAL | Status: AC
  Administered 2014-03-18: 1 via ORAL
  Filled 2014-03-18: qty 1

## 2014-03-18 NOTE — ED Notes (Signed)
Pt reports fell down some steps a few days ago but did not have any pain in back.  Reports 2 days ago started coughing, having fever, and pain in upper back on right side.  Pt says pain is worse with deep breaths.  Reports cough nonproductive.

## 2014-03-18 NOTE — Discharge Instructions (Signed)
Do not take the narcotic if you are driving as it will make you sleepy. Follow up with your doctor. Return here as needed.  STOP SMOKING

## 2014-03-18 NOTE — ED Provider Notes (Signed)
CSN: [id number]     Arrival date & time 03/18/14  1212 History   First MD Initiated Contact with Patient 03/18/14 1223     Chief Complaint  Patient presents with  . Back Pain  . Cough     (Consider location/radiation/quality/duration/timing/severity/associated sxs/prior Treatment) Patient is a 23 y.o. male presenting with cough. The history is provided by the patient.  Cough Onset quality:  Gradual Duration:  2 days Timing:  Intermittent Progression:  Worsening Chronicity:  New Smoker: former.   Relieved by:  None tried Associated symptoms: fever and sore throat   Associated symptoms: no ear pain and no rash    Christopher Walton is a 23 y.o. male who presents to the ED with cough fever and back pain x 3 days. He states that 3 days ago he slipped and fell down 5 steps and landed on right side. Since then he has had pain in the upper right back. He has been taking tylenol without relief. He also started a cough about the same time and when he coughs it makes the pain in his right back worse. The pain radiates from the right upper back into the right shoulder to the right side of the neck. He also reports running fever with the cough up to 102. He has a history of bronchitis and this feels similar. Wakes up coughing at night. He is a former smoker.   Past Medical History  Diagnosis Date  . Kidney stones    Past Surgical History  Procedure Laterality Date  . Kidney stone surgery     Family History  Problem Relation Age of Onset  . Hypertension Mother    History  Substance Use Topics  . Smoking status: Former Smoker -- 1.00 packs/day for 4 years    Types: Cigarettes    Quit date: 06/22/2004  . Smokeless tobacco: Current User    Types: Chew  . Alcohol Use: Yes     Comment: twice a month    Review of Systems  Constitutional: Positive for fever.  HENT: Positive for congestion, sinus pressure and sore throat. Negative for ear pain, facial swelling and trouble swallowing.   Eyes:  Negative for visual disturbance.  Respiratory: Positive for cough.   Gastrointestinal: Negative for nausea. Vomiting: only with cough.  Genitourinary: Negative for dysuria, frequency and decreased urine volume.  Skin: Negative for rash.  Neurological: Negative for syncope and light-headedness.  Psychiatric/Behavioral: Negative for confusion. The patient is not nervous/anxious.       Allergies  Review of patient's allergies indicates no known allergies.  Home Medications   Prior to Admission medications   Medication Sig Start Date End Date Taking? Authorizing Provider  acetaminophen (TYLENOL) 500 MG tablet Take 1,000 mg by mouth every 8 (eight) hours as needed for moderate pain.   Yes Historical Provider, MD   BP 114/66  Pulse 60  Temp(Src) 98.5 F (36.9 C) (Oral)  Resp 16  Ht 5\' 11"  (1.803 m)  Wt 200 lb (90.719 kg)  BMI 27.91 kg/m2  SpO2 100% Physical Exam  Nursing note and vitals reviewed. Constitutional: He is oriented to person, place, and time. He appears well-developed and well-nourished. No distress.  HENT:  Head: Normocephalic and atraumatic.  Eyes: Conjunctivae and EOM are normal. Pupils are equal, round, and reactive to light.  Neck: Trachea normal. Neck supple. Spinous process tenderness and muscular tenderness present. Decreased range of motion (due to pain) present.  Cardiovascular: Normal rate and regular rhythm.   Pulmonary/Chest: Effort  normal. No respiratory distress. He has no wheezes. He has no rales.  Abdominal: Soft. Bowel sounds are normal. There is no tenderness.  Musculoskeletal: He exhibits no edema.       Cervical back: He exhibits decreased range of motion, tenderness, bony tenderness and spasm. He exhibits no laceration and normal pulse.       Back:  Neurological: He is alert and oriented to person, place, and time. He has normal strength. No cranial nerve deficit or sensory deficit. Gait normal.  Reflex Scores:      Bicep reflexes are 2+ on the  right side and 2+ on the left side.      Brachioradialis reflexes are 2+ on the right side and 2+ on the left side.      Patellar reflexes are 2+ on the right side and 2+ on the left side.      Achilles reflexes are 2+ on the right side and 2+ on the left side. Skin: Skin is warm and dry.  Psychiatric: He has a normal mood and affect. His behavior is normal.    ED Course  Procedures  Dr. Manus Gunning in to examine the patient and request blood work, MR of C/spine and blood cultures since the patient reports fever up to 102 and has neck pain. It is unclear if the neck pain is due to the fall or the fever.   Results for orders placed during the hospital encounter of 03/18/14 (from the past 24 hour(s))  CBC WITH DIFFERENTIAL     Status: Abnormal   Collection Time    03/18/14  2:28 PM      Result Value Ref Range   WBC 12.5 (*) 4.0 - 10.5 K/uL   RBC 4.67  4.22 - 5.81 MIL/uL   Hemoglobin 13.7  13.0 - 17.0 g/dL   HCT 96.2  95.2 - 84.1 %   MCV 85.2  78.0 - 100.0 fL   MCH 29.3  26.0 - 34.0 pg   MCHC 34.4  30.0 - 36.0 g/dL   RDW 32.4  40.1 - 02.7 %   Platelets 298  150 - 400 K/uL   Neutrophils Relative % 76  43 - 77 %   Neutro Abs 9.4 (*) 1.7 - 7.7 K/uL   Lymphocytes Relative 17  12 - 46 %   Lymphs Abs 2.1  0.7 - 4.0 K/uL   Monocytes Relative 6  3 - 12 %   Monocytes Absolute 0.8  0.1 - 1.0 K/uL   Eosinophils Relative 1  0 - 5 %   Eosinophils Absolute 0.2  0.0 - 0.7 K/uL   Basophils Relative 0  0 - 1 %   Basophils Absolute 0.0  0.0 - 0.1 K/uL  COMPREHENSIVE METABOLIC PANEL     Status: Abnormal   Collection Time    03/18/14  2:28 PM      Result Value Ref Range   Sodium 142  137 - 147 mEq/L   Potassium 4.3  3.7 - 5.3 mEq/L   Chloride 101  96 - 112 mEq/L   CO2 32  19 - 32 mEq/L   Glucose, Bld 87  70 - 99 mg/dL   BUN 8  6 - 23 mg/dL   Creatinine, Ser 2.53  0.50 - 1.35 mg/dL   Calcium 9.7  8.4 - 66.4 mg/dL   Total Protein 7.5  6.0 - 8.3 g/dL   Albumin 3.9  3.5 - 5.2 g/dL   AST 57 (*) 0 -  37 U/L  ALT 88 (*) 0 - 53 U/L   Alkaline Phosphatase 124 (*) 39 - 117 U/L   Total Bilirubin 0.5  0.3 - 1.2 mg/dL   GFR calc non Af Amer >90  >90 mL/min   GFR calc Af Amer >90  >90 mL/min   Anion gap 9  5 - 15    Dg Chest 2 View  03/18/2014   CLINICAL DATA:  Generalized back pain for 2 days, sporadic fever  EXAM: CHEST  2 VIEW  COMPARISON:  01/12/2007 chest radiograph; thoracic spine radiographs 12/24/2011  FINDINGS: Normal heart size, mediastinal contours and pulmonary vascularity.  Lungs clear.  No pleural effusion or pneumothorax.  Bones normal appearance, with stable appearance of thoracic spine since previous exam.  IMPRESSION: No acute abnormalities.   Electronically Signed   By: Ulyses Southward M.D.   On: 03/18/2014 12:40   Ct Cervical Spine Wo Contrast  03/18/2014   CLINICAL DATA:  Post fall 2 days ago now with right-sided neck pain and headache.  EXAM: CT CERVICAL SPINE WITHOUT CONTRAST  TECHNIQUE: Multidetector CT imaging of the cervical spine was performed without intravenous contrast. Multiplanar CT image reconstructions were also generated.  COMPARISON:  Cervical spine MRI - 03/19/2013  FINDINGS: C1 to the superior endplate of T2 is imaged.  Normal alignment of the cervical spine. No anterolisthesis or retrolisthesis. The dens is normally positioned between the lateral masses of C1. Normal atlantodental and atlantoaxial articulations. The bilateral facets are normally aligned.  No fracture or static subluxation of the cervical spine. Cervical vertebral body heights are preserved. Prevertebral soft tissues are normal.  Intervertebral disc space heights are preserved.  Note is made of a new approximately 0.8 x 1.0 cm lucent lesion within the right-side of the C5 vertebral body. This lesion appears to extend into the right C5 pedicle. No associated periosteal reaction or soft tissue mass.  No cervical lymphadenopathy. Normal noncontrast appearance of the thyroid gland. Limited visualization of lung  apices are normal.  IMPRESSION: 1. No fracture or static subluxation of the cervical spine. 2. Indeterminate approximately 1 cm lucent lesion within the right lateral aspect of the C5 vertebral body poorly visualized on the prior noncontrast MRI though possibly representative of a hemangioma. As such, further evaluation with contrast-enhanced cervical spine MRI is recommended for further lesion characterization.   Electronically Signed   By: Simonne Come M.D.   On: 03/18/2014 13:41   Mr Cervical Spine Wo Contrast  03/18/2014   CLINICAL DATA:  Abnormal CT scan.  EXAM: MRI CERVICAL SPINE WITHOUT CONTRAST  TECHNIQUE: Multiplanar, multisequence MR imaging of the cervical spine was performed. No intravenous contrast was administered.  COMPARISON:  CT of the cervical spine from the same day. MRI of the cervical spine 03/19/2013.  FINDINGS: A solitary focus of T2 hyperintensity along the central canal spinal cord at C6-7 is stable. Normal signal is otherwise present throughout the cervical spinal cord.  The lesion at C5 demonstrates artifact from vessels at this level. There is no abnormal marrow signal. This likely represents a benign lesion such as a hemangioma with very low fat content. No extra osseous lesions are present.  Craniocervical junction is within normal limits.  Mild uncovertebral spurring is again noted at C3-4 and C4-5. There is no significant stenosis. A rightward disc osteophyte complex is present at C5-6 with partial effacement of the ventral CSF but no significant stenosis.  IMPRESSION: 1. Minimal signal heterogeneity suggesting small vessels at the level of the C5 lesion. 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 There is no abnormal T2 or STIR signal. Although not entirely specific, this is highly likely a benign lesion. 2. Minimal degenerative changes in the cervical spine are stable. 3. Tiny T2 focus within or adjacent to the central canal at the level of C6-7 is stable.   Electronically Signed   By:  Gennette Pac M.D.   On: 03/18/2014 15:35    MDM  23 y.o. male with cervical spine pain, thoracic back pain, cough and fever x 2 days s/p fall. Will treat for contusions and muscle strain and will treat for bronchitis. I have reviewed this patient's vital signs, nurses notes, appropriate labs and imaging.  I have discussed findings and plan of care with the patient and he voices understanding and agrees with plan.    Medication List    TAKE these medications       azithromycin 250 MG tablet  Commonly known as:  ZITHROMAX  Take 1 tablet (250 mg total) by mouth daily. Take first 2 tablets together, then 1 every day until finished.     naproxen 500 MG tablet  Commonly known as:  NAPROSYN  Take 1 tablet (500 mg total) by mouth 2 (two) times daily.     oxyCODONE-acetaminophen 5-325 MG per tablet  Commonly known as:  ROXICET  Take 1 tablet by mouth every 4 (four) hours as needed for severe pain.      ASK your doctor about these medications       acetaminophen 500 MG tablet  Commonly known as:  TYLENOL  Take 1,000 mg by mouth every 8 (eight) hours as needed for moderate pain.           [place], [place] 03/18/14 2123

## 2014-03-18 NOTE — ED Notes (Signed)
Pt back from MRI 

## 2014-03-18 NOTE — ED Notes (Signed)
Pt states he has generalized back pain x2 days, pt states he also has been running sporadic fevers.

## 2014-03-18 NOTE — ED Provider Notes (Signed)
Medical screening examination/treatment/procedure(s) were conducted as a shared visit with non-physician practitioner(s) and myself.  I personally evaluated the patient during the encounter.  Fall 5 days ago with R upper back and neck pain.  No LOC.  2 days of cough and fever to 102.  No IVDU.  No meningismus.  Neuro intact.  TTP R upper back and paraspinal cervical muscles. No murmur.  Abnormal CT C spine.  MRI suggest hematoma. No evidence of meningitis or epidural abscess. No tachycardia or hypoxia to suggest PE.  Pain is reproducible.   EKG Interpretation None       Glynn OctaveStephen Paislei Dorval, MD 03/18/14 2136

## 2014-03-23 LAB — CULTURE, BLOOD (ROUTINE X 2)
CULTURE: NO GROWTH
Culture: NO GROWTH

## 2014-04-27 ENCOUNTER — Emergency Department (HOSPITAL_COMMUNITY)
Admission: EM | Admit: 2014-04-27 | Discharge: 2014-04-27 | Disposition: A | Discharge status: Home / Self Care | Payer: BC Managed Care – PPO | Attending: Emergency Medicine | Admitting: Emergency Medicine

## 2014-04-27 ENCOUNTER — Encounter (HOSPITAL_COMMUNITY): Payer: Self-pay | Admitting: Emergency Medicine

## 2014-04-27 ENCOUNTER — Emergency Department (HOSPITAL_COMMUNITY): Payer: BC Managed Care – PPO

## 2014-04-27 DIAGNOSIS — Z87891 Personal history of nicotine dependence: Secondary | ICD-10-CM | POA: Insufficient documentation

## 2014-04-27 DIAGNOSIS — M542 Cervicalgia: Secondary | ICD-10-CM | POA: Insufficient documentation

## 2014-04-27 DIAGNOSIS — IMO0002 Reserved for concepts with insufficient information to code with codable children: Secondary | ICD-10-CM | POA: Insufficient documentation

## 2014-04-27 DIAGNOSIS — Y929 Unspecified place or not applicable: Secondary | ICD-10-CM | POA: Insufficient documentation

## 2014-04-27 DIAGNOSIS — X58XXXA Exposure to other specified factors, initial encounter: Secondary | ICD-10-CM | POA: Diagnosis not present

## 2014-04-27 DIAGNOSIS — Z87442 Personal history of urinary calculi: Secondary | ICD-10-CM | POA: Insufficient documentation

## 2014-04-27 DIAGNOSIS — Y939 Activity, unspecified: Secondary | ICD-10-CM | POA: Diagnosis not present

## 2014-04-27 DIAGNOSIS — S46911A Strain of unspecified muscle, fascia and tendon at shoulder and upper arm level, right arm, initial encounter: Secondary | ICD-10-CM

## 2014-04-27 HISTORY — DX: Reserved for concepts with insufficient information to code with codable children: IMO0002

## 2014-04-27 MED ORDER — CYCLOBENZAPRINE HCL 5 MG PO TABS: 5.0000 mg | ORAL_TABLET | Freq: Three times a day (TID) | ORAL | Status: AC | PRN

## 2014-04-27 MED ORDER — OXYCODONE-ACETAMINOPHEN 5-325 MG PO TABS
1.0000 | ORAL_TABLET | Freq: Once | ORAL | Status: AC
  Administered 2014-04-27: 1 via ORAL
  Filled 2014-04-27: qty 1

## 2014-04-27 MED ORDER — OXYCODONE-ACETAMINOPHEN 5-325 MG PO TABS: 1.0000 | ORAL_TABLET | ORAL | Status: AC | PRN

## 2014-04-27 MED ORDER — IBUPROFEN 600 MG PO TABS: 600.0000 mg | ORAL_TABLET | Freq: Four times a day (QID) | ORAL | Status: AC | PRN

## 2014-04-27 NOTE — ED Notes (Signed)
PT c/o neck pain radiating into his right arm x2 days. PT states he has a hx of bulging disc to his cervical spine.

## 2014-04-27 NOTE — Discharge Instructions (Signed)
Shoulder Sprain °A shoulder sprain is the result of damage to the tough, fiber-like tissues (ligaments) that help hold your shoulder in place. The ligaments may be stretched or torn. Besides the main shoulder joint (the ball and socket), there are several smaller joints that connect the bones in this area. A sprain usually involves one of those joints. Most often it is the acromioclavicular (or AC) joint. That is the joint that connects the collarbone (clavicle) and the shoulder blade (scapula) at the top point of the shoulder blade (acromion). °A shoulder sprain is a mild form of what is called a shoulder separation. Recovering from a shoulder sprain may take some time. For some, pain lingers for several months. Most people recover without long term problems. °CAUSES  °· A shoulder sprain is usually caused by some kind of trauma. This might be: °¨ Falling on an outstretched arm. °¨ Being hit hard on the shoulder. °¨ Twisting the arm. °· Shoulder sprains are more likely to occur in people who: °¨ Play sports. °¨ Have balance or coordination problems. °SYMPTOMS  °· Pain when you move your shoulder. °· Limited ability to move the shoulder. °· Swelling and tenderness on top of the shoulder. °· Redness or warmth in the shoulder. °· Bruising. °· A change in the shape of the shoulder. °DIAGNOSIS  °Your healthcare provider may: °· Ask about your symptoms. °· Ask about recent activity that might have caused those symptoms. °· Examine your shoulder. You may be asked to do simple exercises to test movement. The other shoulder will be examined for comparison. °· Order some tests that provide a look inside the body. They can show the extent of the injury. The tests could include: °¨ X-rays. °¨ CT (computed tomography) scan. °¨ MRI (magnetic resonance imaging) scan. °RISKS AND COMPLICATIONS °· Loss of full shoulder motion. °· Ongoing shoulder pain. °TREATMENT  °How long it takes to recover from a shoulder sprain depends on how  severe it was. Treatment options may include: °· Rest. You should not use the arm or shoulder until it heals. °· Ice. For 2 or 3 days after the injury, put an ice pack on the shoulder up to 4 times a day. It should stay on for 15 to 20 minutes each time. Wrap the ice in a towel so it does not touch your skin. °· Over-the-counter medicine to relieve pain. °· A sling or brace. This will keep the arm still while the shoulder is healing. °· Physical therapy or rehabilitation exercises. These will help you regain strength and motion. Ask your healthcare provider when it is OK to begin these exercises. °· Surgery. The need for surgery is rare with a sprained shoulder, but some people may need surgery to keep the joint in place and reduce pain. °HOME CARE INSTRUCTIONS  °· Ask your healthcare provider about what you should and should not do while your shoulder heals. °· Make sure you know how to apply ice to the correct area of your shoulder. °· Talk with your healthcare provider about which medications should be used for pain and swelling. °· If rehabilitation therapy will be needed, ask your healthcare provider to refer you to a therapist. If it is not recommended, then ask about at-home exercises. Find out when exercise should begin. °SEEK MEDICAL CARE IF:  °Your pain, swelling, or redness at the joint increases. °SEEK IMMEDIATE MEDICAL CARE IF:  °· You have a fever. °· You cannot move your arm or shoulder. °Document Released: 01/05/2009 Document   Revised: 11/11/2011 Document Reviewed: 01/05/2009 °[employer]® Patient Information ©2015 [employer]. This information is not intended to replace advice given to you by your health care provider. Make sure you discuss any questions you have with your health care provider. ° °

## 2014-04-28 NOTE — ED Provider Notes (Signed)
CSN: [id number]     Arrival date & time 04/27/14  4098 History   First MD Initiated Contact with Patient 04/27/14 1021     Chief Complaint  Patient presents with  . Neck Pain     (Consider location/radiation/quality/duration/timing/severity/associated sxs/prior Treatment) HPI  Christopher Walton is a 23 y.o. male presenting with right neck and shoulder pain which radiates into his right upper arm and has been present for the past 2 days.  He denies any specific injury but does have a history of cervical degenerative disc disease as diagnosed per MRI at his last visit here May July after falling down steps.  He has decreased range of motion in his neck, particularly with attempts to rotate rightward.  He woke with his symptoms 2 days ago. He denies weakness or numbness in his upper extremities.  He has found no alleviators for his symptoms.  Movement makes his symptoms worse.   Past Medical History  Diagnosis Date  . Kidney stones   . Bulging disc    Past Surgical History  Procedure Laterality Date  . Kidney stone surgery     Family History  Problem Relation Age of Onset  . Hypertension Mother    History  Substance Use Topics  . Smoking status: Former Smoker -- 1.00 packs/day for 4 years    Types: Cigarettes    Quit date: 06/22/2004  . Smokeless tobacco: Current User    Types: Chew  . Alcohol Use: Yes     Comment: twice a month    Review of Systems  Constitutional: Negative for fever.  Musculoskeletal: Positive for arthralgias. Negative for joint swelling and myalgias.  Neurological: Negative for weakness and numbness.      Allergies  Review of patient's allergies indicates no known allergies.  Home Medications   Prior to Admission medications   Medication Sig Start Date End Date Taking? Authorizing Provider  cyclobenzaprine (FLEXERIL) 5 MG tablet Take 1 tablet (5 mg total) by mouth 3 (three) times daily as needed for muscle spasms. 04/27/14   Burgess Amor, PA-C   ibuprofen (ADVIL,MOTRIN) 600 MG tablet Take 1 tablet (600 mg total) by mouth every 6 (six) hours as needed. 04/27/14   Burgess Amor, PA-C  oxyCODONE-acetaminophen (PERCOCET/ROXICET) 5-325 MG per tablet Take 1 tablet by mouth every 4 (four) hours as needed. 04/27/14   Burgess Amor, PA-C   BP 146/83  Pulse 72  Temp(Src) 98.4 F (36.9 C) (Oral)  Resp 18  Ht 6' (1.829 m)  Wt 185 lb (83.915 kg)  BMI 25.08 kg/m2  SpO2 100% Physical Exam  Constitutional: He appears well-developed and well-nourished.  HENT:  Head: Atraumatic.  Neck: Normal range of motion.  Cardiovascular:  Pulses equal bilaterally  Musculoskeletal: He exhibits tenderness.  Tender to palpation along right lateral shoulder joint without deformity, ecchymosis or visible trauma.  He also has tenderness to palpation along his posterior upper arm and across his right upper scapula without any palpable deformity.  Muscle spasm appreciated.  He has equal grip strength.  He has increased pain with attempts at shoulder flexion and extension.  No pain with wrist and elbow resisted flexion/extension.  Neurological: He is alert. He has normal strength. He displays normal reflexes. No sensory deficit.  Skin: Skin is warm and dry.  Psychiatric: He has a normal mood and affect.     ED Course  Procedures (including critical care time) Labs Review Labs Reviewed - No data to display  Imaging Review Dg Shoulder Right  04/27/2014  CLINICAL DATA:  23 year old male with neck pain radiating to the shoulder for 2 days. Initial encounter.  EXAM: RIGHT SHOULDER - 2+ VIEW  COMPARISON:  10/12/2010.  FINDINGS: Stable bone mineralization, including in the proximal right humerus shaft. No glenohumeral joint dislocation. Proximal right humerus appears intact. Right clavicle and scapula appear intact. Visible right ribs and lung parenchyma within normal limits.  IMPRESSION: No acute osseous abnormality identified at the right shoulder.   Electronically Signed    By: Augusto Gamble M.D.   On: 04/27/2014 12:29     EKG Interpretation None      MDM   Final diagnoses:  Shoulder strain, right, initial encounter    Patients labs and/or radiological studies were viewed and considered during the medical decision making and disposition process. Patient was placed  in arm sling which did improve his shoulder pain symptoms.  He was prescribed oxycodone, ibuprofen and Flexeril.  He was encouraged to continue using heat therapy 20 minutes 3 times a day.  Referral to or so for followup care if symptoms are not improving over the next week.    Burgess Amor, PA-C 04/28/14 Windell Moment

## 2014-04-29 NOTE — ED Provider Notes (Signed)
Medical screening examination/treatment/procedure(s) were performed by non-physician practitioner and as supervising physician I was immediately available for consultation/collaboration.    Joeph Szatkowski, MD 04/29/14 1332 

## 2014-05-18 ENCOUNTER — Emergency Department (HOSPITAL_COMMUNITY): Payer: BC Managed Care – PPO

## 2014-05-18 ENCOUNTER — Emergency Department (HOSPITAL_COMMUNITY)
Admission: EM | Admit: 2014-05-18 | Discharge: 2014-05-18 | Disposition: A | Discharge status: Home / Self Care | Payer: BC Managed Care – PPO | Attending: Emergency Medicine | Admitting: Emergency Medicine

## 2014-05-18 ENCOUNTER — Encounter (HOSPITAL_COMMUNITY): Payer: Self-pay | Admitting: Emergency Medicine

## 2014-05-18 DIAGNOSIS — Z79899 Other long term (current) drug therapy: Secondary | ICD-10-CM | POA: Insufficient documentation

## 2014-05-18 DIAGNOSIS — Z87442 Personal history of urinary calculi: Secondary | ICD-10-CM | POA: Insufficient documentation

## 2014-05-18 DIAGNOSIS — Z87891 Personal history of nicotine dependence: Secondary | ICD-10-CM | POA: Diagnosis not present

## 2014-05-18 DIAGNOSIS — M25519 Pain in unspecified shoulder: Secondary | ICD-10-CM | POA: Diagnosis not present

## 2014-05-18 DIAGNOSIS — M25511 Pain in right shoulder: Secondary | ICD-10-CM

## 2014-05-18 MED ORDER — METHOCARBAMOL 500 MG PO TABS: 500.0000 mg | ORAL_TABLET | Freq: Three times a day (TID) | ORAL | Status: AC | PRN

## 2014-05-18 MED ORDER — IBUPROFEN 800 MG PO TABS: 800.0000 mg | ORAL_TABLET | Freq: Three times a day (TID) | ORAL | Status: AC

## 2014-05-18 NOTE — Discharge Instructions (Signed)
Take ibuprofen as directed. Take Robaxin as prescribed, no driving or lifting heavy machinery while taking Robaxin as it may cause drowsiness. Apply ice alternated with heat pad to your shoulder. Followup with the orthopedist.  Shoulder Pain The shoulder is the joint that connects your arm to your body. Muscles and band-like tissues that connect bones to muscles (tendons) hold the joint together. Shoulder pain is felt if an injury or medical problem affects one or more parts of the shoulder. HOME CARE   Put ice on the sore area.  Put ice in a plastic bag.  Place a towel between your skin and the bag.  Leave the ice on for 15-20 minutes, 03-04 times a day for the first 2 days.  Stop using cold packs if they do not help with the pain.  If you were given something to keep your shoulder from moving (sling; shoulder immobilizer), wear it as told. Only take it off to shower or bathe.  Move your arm as little as possible, but keep your hand moving to prevent puffiness (swelling).  Squeeze a soft ball or foam pad as much as possible to help prevent swelling.  Take medicine as told by your doctor. GET HELP IF:  You have progressing new pain in your arm, hand, or fingers.  Your hand or fingers get cold.  Your medicine does not help lessen your pain. GET HELP RIGHT AWAY IF:   Your arm, hand, or fingers are numb or tingling.  Your arm, hand, or fingers are puffy (swollen), painful, or turn white or blue. MAKE SURE YOU:   Understand these instructions.  Will watch your condition.  Will get help right away if you are not doing well or get worse. Document Released: 02/05/2008 Document Revised: 01/03/2014 Document Reviewed: 03/02/2012 [facility] Patient Information 2015 [place], [place]. This information is not intended to replace advice given to you by your health care provider. Make sure you discuss any questions you have with your health care provider.

## 2014-05-18 NOTE — ED Notes (Signed)
Right shoulder pain x 2 weeks.  Unknown injury.

## 2014-05-18 NOTE — ED Notes (Signed)
Pt reports pain to the right shoulder x 2 weeks with no recall of injury. States pain radiates to the right side of neck. Limited range of motion to the right shoulder due to pain

## 2014-05-18 NOTE — ED Provider Notes (Signed)
CSN: [id number]     Arrival date & time 05/18/14  1552 History   First MD Initiated Contact with Patient 05/18/14 1724     Chief Complaint  Patient presents with  . Shoulder Pain     (Consider location/radiation/quality/duration/timing/severity/associated sxs/prior Treatment) HPI Comments: Patient is a 23 year old male who presents to the emergency department complaining of right shoulder pain for the past 2-3 weeks. No known injury or trauma. He was seen in the emergency department on August 27 for the same, states his pain is worsening. 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 At the last emergency department visit, he was given ibuprofen, Flexeril and Percocet. He states the Percocet would take the edge off the pain. Pain described as sharp, worse around his scapular area, radiating to the right side of his neck, worse with movement. Denies numbness or tingling. States he is trying to get an appt with the orthopedist.  Patient is a 23 y.o. male presenting with shoulder pain. The history is provided by the patient.  Shoulder Pain Pertinent negatives include no fever or numbness.    Past Medical History  Diagnosis Date  . Kidney stones   . Bulging disc    Past Surgical History  Procedure Laterality Date  . Kidney stone surgery     Family History  Problem Relation Age of Onset  . Hypertension Mother    History  Substance Use Topics  . Smoking status: Former Smoker -- 1.00 packs/day for 4 years    Types: Cigarettes    Quit date: 06/22/2004  . Smokeless tobacco: Current User    Types: Chew  . Alcohol Use: Yes     Comment: twice a month    Review of Systems  Constitutional: Negative for fever.  HENT: Negative.   Respiratory: Negative.   Cardiovascular: Negative.   Musculoskeletal:       + right shoulder pain.  Skin: Negative.   Neurological: Negative for numbness.      Allergies  Review of patient's allergies indicates no known allergies.  Home  Medications   Prior to Admission medications   Medication Sig Start Date End Date Taking? Authorizing Provider  cyclobenzaprine (FLEXERIL) 5 MG tablet Take 1 tablet (5 mg total) by mouth 3 (three) times daily as needed for muscle spasms. 04/27/14   Burgess Amor, PA-C  ibuprofen (ADVIL,MOTRIN) 600 MG tablet Take 1 tablet (600 mg total) by mouth every 6 (six) hours as needed. 04/27/14   Burgess Amor, PA-C  ibuprofen (ADVIL,MOTRIN) 800 MG tablet Take 1 tablet (800 mg total) by mouth 3 (three) times daily. 05/18/14   Trevor Mace, PA-C  methocarbamol (ROBAXIN) 500 MG tablet Take 1 tablet (500 mg total) by mouth every 8 (eight) hours as needed for muscle spasms. 05/18/14   Trevor Mace, PA-C  oxyCODONE-acetaminophen (PERCOCET/ROXICET) 5-325 MG per tablet Take 1 tablet by mouth every 4 (four) hours as needed. 04/27/14   Burgess Amor, PA-C   BP 135/76  Pulse 66  Temp(Src) 98.4 F (36.9 C) (Oral)  Resp 16  Ht 6' (1.829 m)  Wt 190 lb (86.183 kg)  BMI 25.76 kg/m2  SpO2 99% Physical Exam  Nursing note and vitals reviewed. Constitutional: He is oriented to person, place, and time. He appears well-developed and well-nourished. No distress.  HENT:  Head: Normocephalic and atraumatic.  Eyes: Conjunctivae and EOM are normal.  Neck: Normal range of motion. Neck supple.  Cardiovascular: Normal rate, regular rhythm, normal heart  sounds and intact distal pulses.   Pulses:      Radial pulses are 2+ on the right side, and 2+ on the left side.  Pulmonary/Chest: Effort normal and breath sounds normal.  Musculoskeletal: He exhibits no edema.       Arms: TTP right trapezius and scapular area. R shoulder ROM limited by pain, moreso with flexion. No c-spine tenderness. Full cervical ROM. Strength UE 5/5 and equal BL.  Neurological: He is alert and oriented to person, place, and time.  Sensation intact.  Skin: Skin is warm and dry.  Psychiatric: He has a normal mood and affect. His behavior is normal.    ED  Course  Procedures (including critical care time) Labs Review Labs Reviewed - No data to display  Imaging Review Dg Shoulder Right  05/18/2014   CLINICAL DATA:  Right shoulder pain without definitive injury  EXAM: RIGHT SHOULDER - 2+ VIEW  COMPARISON:  04/27/2014 10/12/2010  FINDINGS: No acute fracture or dislocation is noted. 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 No other focal abnormality is noted.  IMPRESSION: Stable benign-appearing lesion from 2012.  No acute abnormality is seen.   Electronically Signed   By: Alcide Clever M.D.   On: 05/18/2014 17:17     EKG Interpretation None      MDM   Final diagnoses:  Right shoulder pain   Pt presenting back with shoulder pain. Afebrile, vital signs stable. He is nontoxic appearing and in no apparent distress. X-ray obtained prior to patient being seen, no acute findings. No swelling or deformity. Range of motion limited by pain. Muscular tenderness on exam. No CP, SOB. Will discharge home with ibuprofen and Robaxin. Advised ice and heat. Followup with orthopedist. Stable for discharge. Return precautions given. Patient states understanding of treatment care plan and is agreeable.  Trevor Mace, PA-C 05/18/14 [phone]

## 2014-05-19 NOTE — ED Provider Notes (Signed)
Medical screening examination/treatment/procedure(s) were performed by non-physician practitioner and as supervising physician I was immediately available for consultation/collaboration.   EKG Interpretation None        Ermina Oberman, MD 05/19/14 0212 

## 2014-06-15 ENCOUNTER — Telehealth: Payer: Self-pay | Admitting: Orthopedic Surgery

## 2014-06-15 NOTE — Telephone Encounter (Signed)
Call received from patient's mother, inquiring about appointment [facility]. Relayed that we will need to speak with patient, due to patient being over 23 years of age.  She will ask him to call directly to discuss appointment.

## 2014-06-24 NOTE — Telephone Encounter (Signed)
No further response from patient.  Schedule upon call back from patient.

## 2014-12-17 IMAGING — CR DG SHOULDER 2+V*R*
3 series · 3 of 3 positions shown · non-contrast
Comparison: 04/27/2014 10/12/2010

CLINICAL DATA: Right shoulder pain without definitive injury

EXAM:
RIGHT SHOULDER - 2+ VIEW

[view not recorded (1 of 3)]
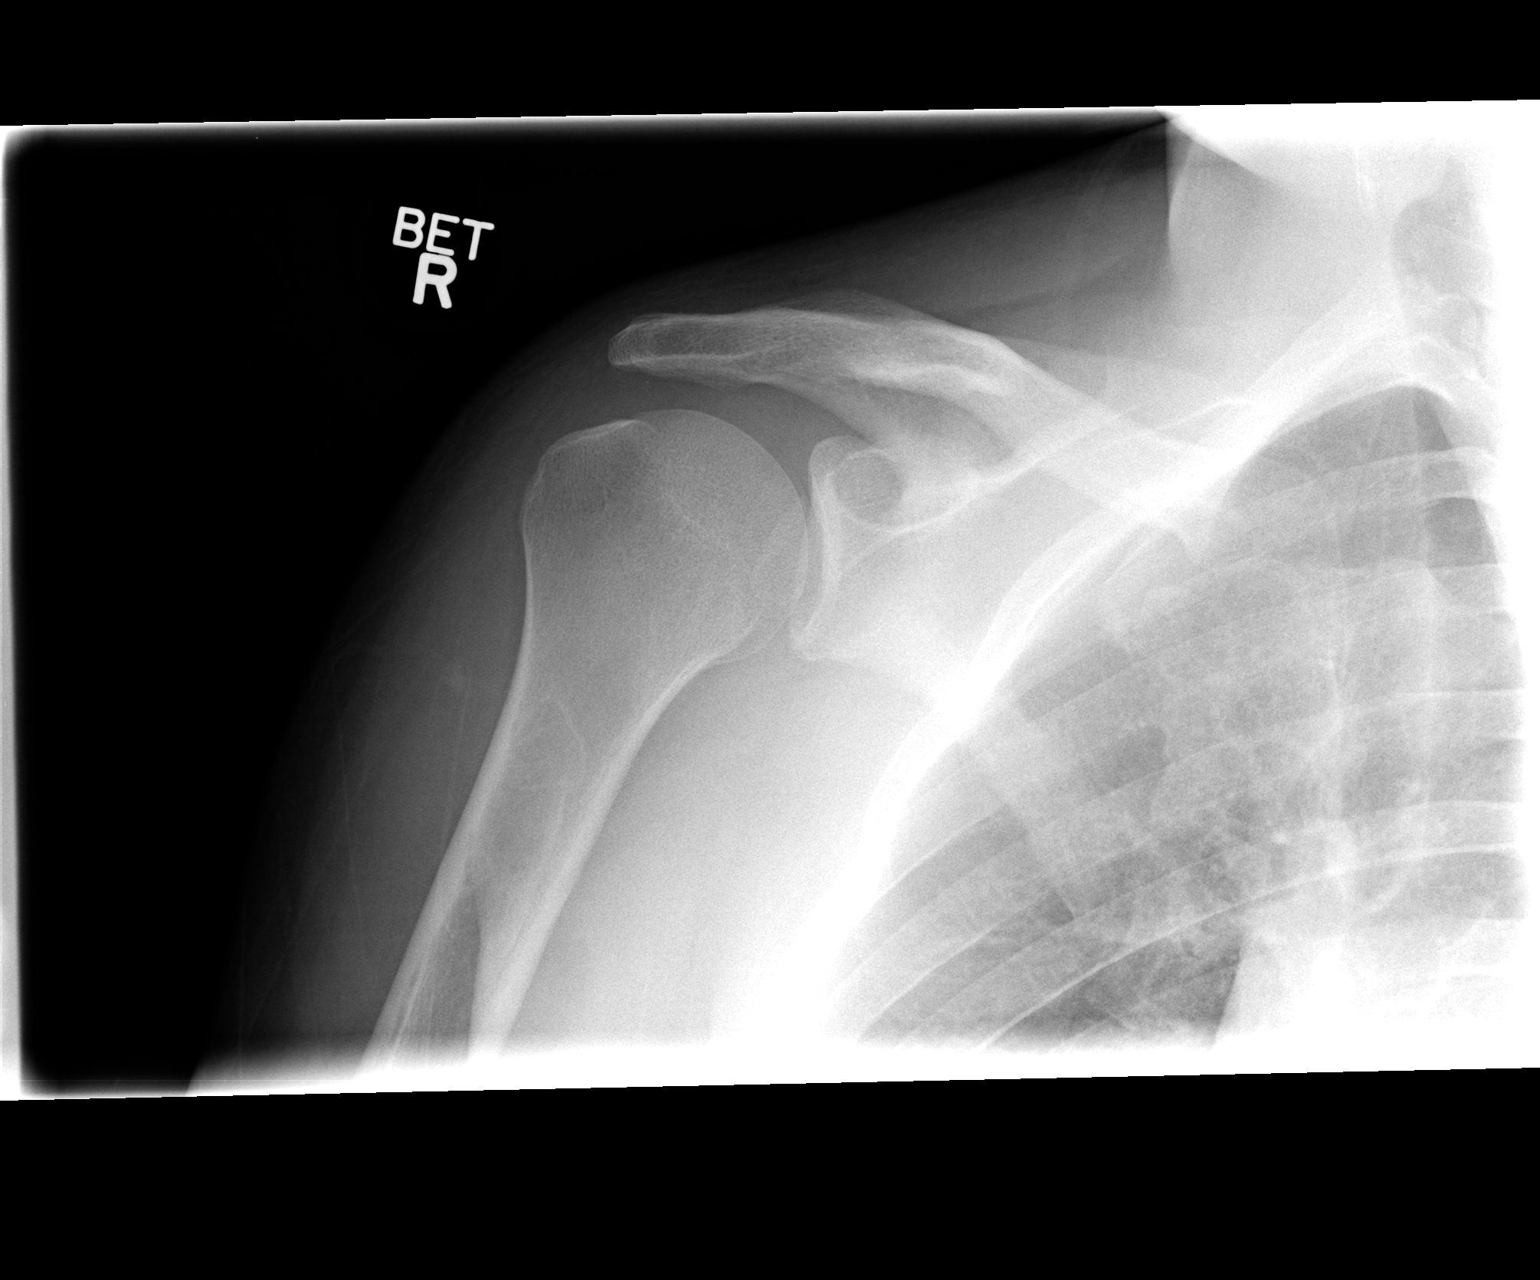

[view not recorded (2 of 3)]
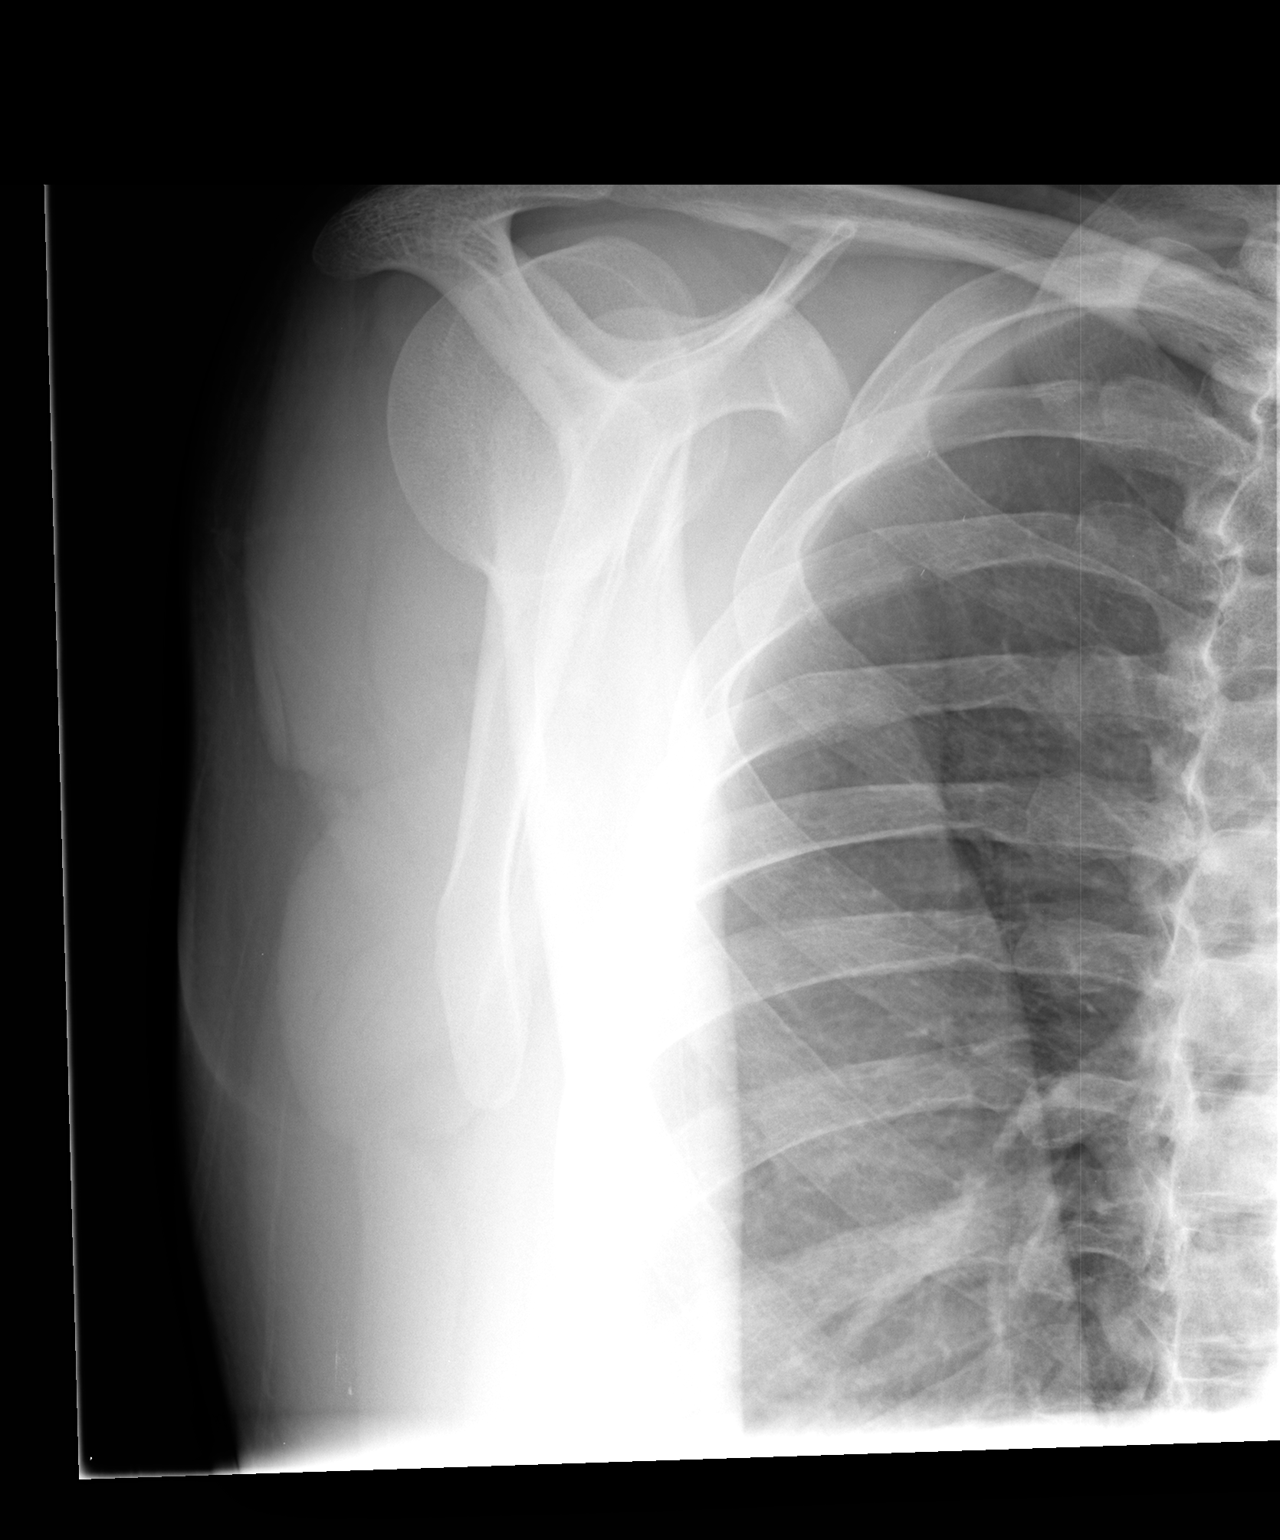

[view not recorded (3 of 3)]
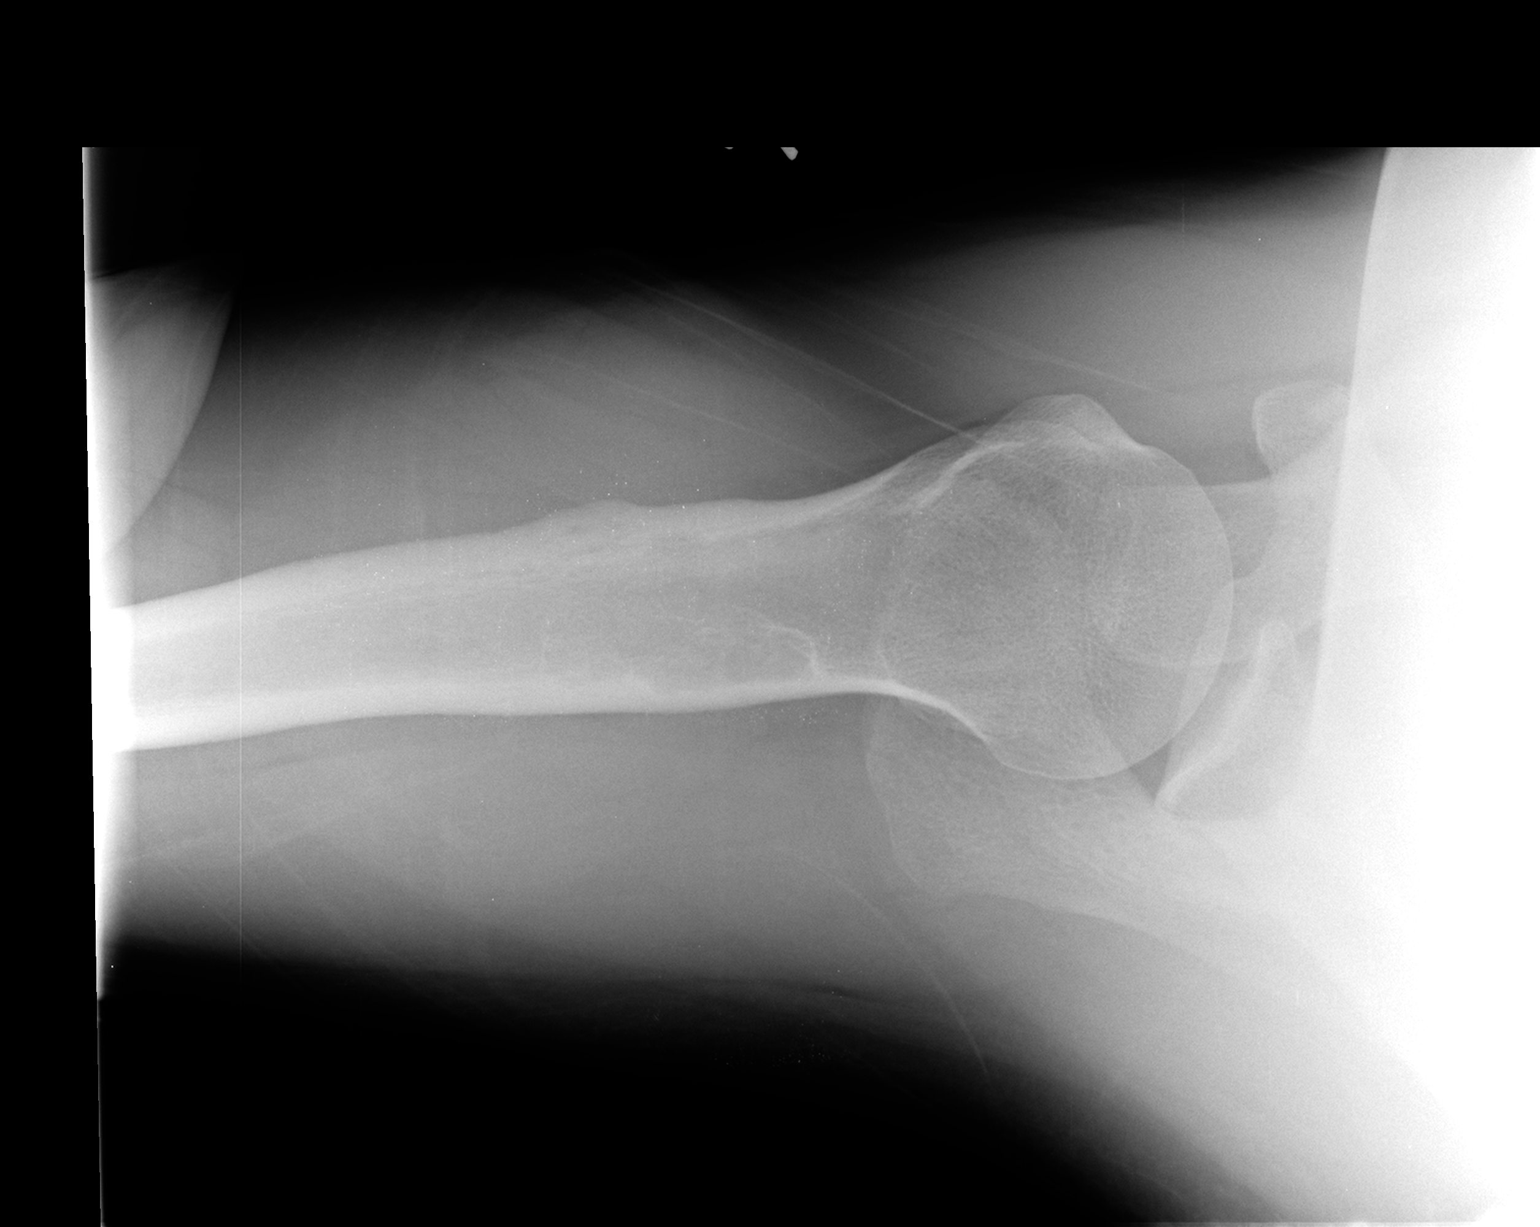

[3 of 3 positions shown; findings below may reference images not displayed]

FINDINGS: No acute fracture or dislocation is noted. There is a cortical based
lucency identified in the proximal humeral shaft consistent with a
nonossifying fibroma. No other focal abnormality is noted.
IMPRESSION: Stable benign-appearing lesion from 8948.

No acute abnormality is seen.

## 2015-04-25 ENCOUNTER — Encounter: Payer: Self-pay | Admitting: Family Medicine

## 2015-04-26 ENCOUNTER — Encounter: Payer: Self-pay | Admitting: Family Medicine

## 2020-06-01 ENCOUNTER — Ambulatory Visit: Payer: Self-pay | Admitting: Orthopaedic Surgery
